# Patient Record
Sex: Male | Born: 1960 | Race: White | Hispanic: No | Marital: Married | Smoking: Never smoker
Health system: Southern US, Community
[De-identification: ages and names within clinical notes are randomized; demographics above are authoritative.]

## PROBLEM LIST (undated history)

## (undated) DIAGNOSIS — I4719 Other supraventricular tachycardia: Secondary | ICD-10-CM

## (undated) DIAGNOSIS — E042 Nontoxic multinodular goiter: Secondary | ICD-10-CM

## (undated) DIAGNOSIS — R002 Palpitations: Secondary | ICD-10-CM

## (undated) DIAGNOSIS — I4891 Unspecified atrial fibrillation: Secondary | ICD-10-CM

## (undated) DIAGNOSIS — G4730 Sleep apnea, unspecified: Secondary | ICD-10-CM

## (undated) DIAGNOSIS — R339 Retention of urine, unspecified: Secondary | ICD-10-CM

## (undated) DIAGNOSIS — I8392 Asymptomatic varicose veins of left lower extremity: Secondary | ICD-10-CM

## (undated) DIAGNOSIS — N400 Enlarged prostate without lower urinary tract symptoms: Secondary | ICD-10-CM

## (undated) DIAGNOSIS — G4733 Obstructive sleep apnea (adult) (pediatric): Secondary | ICD-10-CM

## (undated) DIAGNOSIS — N320 Bladder-neck obstruction: Secondary | ICD-10-CM

## (undated) HISTORY — PX: ATRIAL FIBRILLATION ABLATION: SHX5732

## (undated) HISTORY — PX: KNEE SURGERY: SHX244

## (undated) HISTORY — PX: KNEE ARTHROSCOPY: SUR90

## (undated) HISTORY — PX: SHOULDER SURGERY: SHX246

## (undated) HISTORY — PX: ARTHOSCOPIC ROTAOR CUFF REPAIR: SHX5002

## (undated) HISTORY — PX: WRIST SURGERY: SHX841

## (undated) HISTORY — DX: Asymptomatic varicose veins of left lower extremity: I83.92

---

## 2008-06-20 HISTORY — PX: WRIST SURGERY: SHX841

## 2011-06-21 DIAGNOSIS — I480 Paroxysmal atrial fibrillation: Secondary | ICD-10-CM

## 2011-06-21 HISTORY — PX: CARDIAC ELECTROPHYSIOLOGY MAPPING AND ABLATION: SHX1292

## 2011-06-21 HISTORY — PX: CARDIAC CATHETERIZATION: SHX172

## 2011-06-21 HISTORY — DX: Paroxysmal atrial fibrillation: I48.0

## 2015-06-14 ENCOUNTER — Emergency Department (HOSPITAL_COMMUNITY)
Admission: EM | Admit: 2015-06-14 | Discharge: 2015-06-14 | Disposition: A | Discharge status: Home / Self Care | Payer: BLUE CROSS/BLUE SHIELD | Attending: Emergency Medicine | Admitting: Emergency Medicine

## 2015-06-14 ENCOUNTER — Encounter (HOSPITAL_COMMUNITY): Payer: Self-pay | Admitting: Vascular Surgery

## 2015-06-14 ENCOUNTER — Emergency Department (HOSPITAL_COMMUNITY): Payer: BLUE CROSS/BLUE SHIELD

## 2015-06-14 DIAGNOSIS — W010XXA Fall on same level from slipping, tripping and stumbling without subsequent striking against object, initial encounter: Secondary | ICD-10-CM | POA: Diagnosis not present

## 2015-06-14 DIAGNOSIS — R0781 Pleurodynia: Secondary | ICD-10-CM

## 2015-06-14 DIAGNOSIS — Y9289 Other specified places as the place of occurrence of the external cause: Secondary | ICD-10-CM | POA: Diagnosis not present

## 2015-06-14 DIAGNOSIS — W19XXXA Unspecified fall, initial encounter: Secondary | ICD-10-CM

## 2015-06-14 DIAGNOSIS — Z8679 Personal history of other diseases of the circulatory system: Secondary | ICD-10-CM | POA: Diagnosis not present

## 2015-06-14 DIAGNOSIS — Y998 Other external cause status: Secondary | ICD-10-CM | POA: Diagnosis not present

## 2015-06-14 DIAGNOSIS — S29001A Unspecified injury of muscle and tendon of front wall of thorax, initial encounter: Secondary | ICD-10-CM | POA: Insufficient documentation

## 2015-06-14 DIAGNOSIS — Y9389 Activity, other specified: Secondary | ICD-10-CM | POA: Diagnosis not present

## 2015-06-14 HISTORY — DX: Unspecified atrial fibrillation: I48.91

## 2015-06-14 MED ORDER — DIPHENHYDRAMINE HCL 25 MG PO CAPS
25.0000 mg | ORAL_CAPSULE | Freq: Once | ORAL | Status: AC
  Administered 2015-06-14: 25 mg via ORAL
  Filled 2015-06-14: qty 1

## 2015-06-14 MED ORDER — HYDROCODONE-ACETAMINOPHEN 5-325 MG PO TABS
1.0000 | ORAL_TABLET | Freq: Once | ORAL | Status: AC
  Administered 2015-06-14: 1 via ORAL
  Filled 2015-06-14: qty 1

## 2015-06-14 MED ORDER — NAPROXEN 250 MG PO TABS: 250.0000 mg | ORAL_TABLET | Freq: Two times a day (BID) | ORAL | Status: DC

## 2015-06-14 NOTE — Discharge Instructions (Signed)
Rib Contusion A rib contusion is a deep bruise on your rib area. Contusions are the result of a blunt trauma that causes bleeding and injury to the tissues under the skin. A rib contusion may involve bruising of the ribs and of the skin and muscles in the area. The skin overlying the contusion may turn blue, purple, or yellow. Minor injuries will give you a painless contusion, but more severe contusions may stay painful and swollen for a few weeks. CAUSES  A contusion is usually caused by a blow, trauma, or direct force to an area of the body. This often occurs while playing contact sports. SYMPTOMS  Swelling and redness of the injured area.  Discoloration of the injured area.  Tenderness and soreness of the injured area.  Pain with or without movement. DIAGNOSIS  The diagnosis can be made by taking a medical history and performing a physical exam. An X-ray, CT scan, or MRI may be needed to determine if there were any associated injuries, such as broken bones (fractures) or internal injuries. TREATMENT  Often, the best treatment for a rib contusion is rest. Icing or applying cold compresses to the injured area may help reduce swelling and inflammation. Deep breathing exercises may be recommended to reduce the risk of partial lung collapse and pneumonia. Over-the-counter or prescription medicines may also be recommended for pain control. HOME CARE INSTRUCTIONS   Apply ice to the injured area:  Put ice in a plastic bag.  Place a towel between your skin and the bag.  Leave the ice on for 20 minutes, 2-3 times per day.  Take medicines only as directed by your health care provider.  Rest the injured area. Avoid strenuous activity and any activities or movements that cause pain. Be careful during activities and avoid bumping the injured area.  Perform deep-breathing exercises as directed by your health care provider.  Do not lift anything that is heavier than 5 lb (2.3 kg) until your  health care provider approves.  Do not use any tobacco products, including cigarettes, chewing tobacco, or electronic cigarettes. If you need help quitting, ask your health care provider. SEEK MEDICAL CARE IF:   You have increased bruising or swelling.  You have pain that is not controlled with treatment.  You have a fever. SEEK IMMEDIATE MEDICAL CARE IF:   You have difficulty breathing or shortness of breath.  You develop a continual cough, or you cough up thick or bloody sputum.  You feel sick to your stomach (nauseous), you throw up (vomit), or you have abdominal pain.   This information is not intended to replace advice given to you by your health care provider. Make sure you discuss any questions you have with your health care provider.   Document Released: 03/01/2001 Document Revised: 06/27/2014 Document Reviewed: 03/18/2014 Elsevier Interactive Patient Education 2016 [employer]vier Inc.  

## 2015-06-14 NOTE — ED Provider Notes (Signed)
CSN: [id number]     Arrival date & time 06/14/15  1320 History   First MD Initiated Contact with Patient 06/14/15 1630     Chief Complaint  Patient presents with  . Fall   Mathew Phillips is a 54 y.o. male who is here visiting from [facility] who presents complaining of right rib pain after he tripped and fell earlier today. The patient reports he was tripped by a dog today and fell on his right side around 1 PM. He reports 5 out of 10 pain to his right ribs that is worse with deep inspiration. He denies loss of consciousness. He denies shortness of breath or coughing. He has taken nothing for treatment today. The patient denies taking any anticoagulants. Patient denies fevers, cough, wheezing, shortness of breath, headache, numbness, tingling, weakness, double vision, syncope, lightheadedness, dizziness, jaw pain, nausea, vomiting, loss of bowel or bladder control, or rashes.  (Consider location/radiation/quality/duration/timing/severity/associated sxs/prior Treatment) HPI  Past Medical History  Diagnosis Date  . Atrial fibrillation [facility])    Past Surgical History  Procedure Laterality Date  . Shoulder surgery  both  . Atrial fibrillation ablation    . Knee surgery  both  . Wrist surgery Left    No family history on file. Social History  Substance Use Topics  . Smoking status: Never Smoker   . Smokeless tobacco: Never Used  . Alcohol Use: Yes     Comment: rarely    Review of Systems  Constitutional: Negative for fever and chills.  HENT: Negative for congestion and sore throat.   Eyes: Negative for visual disturbance.  Respiratory: Negative for cough, shortness of breath and wheezing.   Cardiovascular: Negative for chest pain and palpitations.  Gastrointestinal: Negative for nausea, vomiting, abdominal pain and diarrhea.  Genitourinary: Negative for dysuria and difficulty urinating.  Musculoskeletal: Positive for arthralgias. Negative for back pain, neck pain  and neck stiffness.  Skin: Negative for rash.  Neurological: Negative for dizziness, syncope, weakness, numbness and headaches.      Allergies  Codeine  Home Medications   Prior to Admission medications   Medication Sig Start Date End Date Taking? Authorizing Provider  naproxen (NAPROSYN) 250 MG tablet Take 1 tablet (250 mg total) by mouth 2 (two) times daily with a meal. 06/14/15   Waynetta Pean, PA-C   BP 113/74 mmHg  Pulse 69  Temp(Src) 97.6 F (36.4 C) (Oral)  Resp 16  SpO2 100% Physical Exam  Constitutional: He is oriented to person, place, and time. He appears well-developed and well-nourished. No distress.  Nontoxic appearing.  HENT:  Head: Normocephalic and atraumatic.  Mouth/Throat: Oropharynx is clear and moist.  Eyes: Conjunctivae are normal. Pupils are equal, round, and reactive to light. Right eye exhibits no discharge. Left eye exhibits no discharge.  Neck: Neck supple.  Cardiovascular: Normal rate, regular rhythm, normal heart sounds and intact distal pulses.  Exam reveals no gallop and no friction rub.   No murmur heard. Pulmonary/Chest: Effort normal and breath sounds normal. No respiratory distress. He has no wheezes. He has no rales. He exhibits tenderness.  Lungs are clear to auscultation bilaterally. Symmetric chest expansion bilaterally. Patient has tenderness to his right lateral chest wall. No overlying skin changes. No flail segment. No ecchymosis, erythema, edema or warmth.  Abdominal: Soft. He exhibits no distension. There is no tenderness.  Musculoskeletal: He exhibits no edema or tenderness.  No midline neck or back tenderness.  Lymphadenopathy:    He has no cervical adenopathy.  Neurological: He is alert and oriented to person, place, and time. No cranial nerve deficit. Coordination normal.  Patient is alert and oriented 3. Cranial nerves are intact. Sensation is intact to his bilateral upper and lower extremities.  Skin: Skin is warm and dry.  No rash noted. He is not diaphoretic. No erythema. No pallor.  Psychiatric: He has a normal mood and affect. His behavior is normal.  Nursing note and vitals reviewed.   ED Course  Procedures (including critical care time) Labs Review Labs Reviewed - No data to display  Imaging Review Dg Ribs Unilateral W/chest Right  06/14/2015  CLINICAL DATA:  The patient was knocked down by dog today with a fall. The patient heard a crunch. Initial encounter. EXAM: RIGHT RIBS AND CHEST - 3+ VIEW COMPARISON:  None. FINDINGS: There is some biapical scarring. No pneumothorax or pleural effusion. Heart size is normal. No acute fracture is identified. Remote left rib fractures are noted. The patient is status post right shoulder surgery. IMPRESSION: No acute abnormality. Electronically Signed   By: Inge Rise M.D.   On: 06/14/2015 14:45   I have personally reviewed and evaluated these images as part of my medical decision-making.   EKG Interpretation None      Filed Vitals:   06/14/15 1329 06/14/15 1613 06/14/15 1630  BP: 122/76 119/78 113/74  Pulse: 84 69 69  Temp: 97.6 F (36.4 C)    TempSrc: Oral    Resp: 16 16   SpO2: 100% 98% 100%     MDM   Meds given in ED:  Medications  HYDROcodone-acetaminophen (NORCO/VICODIN) 5-325 MG per tablet 1 tablet (not administered)  diphenhydrAMINE (BENADRYL) capsule 25 mg (not administered)    New Prescriptions   NAPROXEN (NAPROSYN) 250 MG TABLET    Take 1 tablet (250 mg total) by mouth 2 (two) times daily with a meal.    Final diagnoses:  Fall, initial encounter  Rib pain on right side   This is a 54 y.o. male who is here visiting from [place] who presents complaining of right rib pain after he tripped and fell earlier today. The patient reports he was tripped by a dog today and fell on his right side around 1 PM. He reports 5 out of 10 pain to his right ribs that is worse with deep inspiration. He denies loss of consciousness.  He denies shortness of breath or coughing. He has taken nothing for treatment today. The patient denies taking any anticoagulants.  On exam the patient is afebrile and nontoxic appearing. His lungs are clear to auscultation bilaterally. Symmetric chest chest expansion bilaterally. Right lateral chest wall is tender to palpation. No crepitus, ecchymosis, erythema or warmth. Right rib x-ray with chest is unremarkable. No evidence of rib fractures. Patient has no evidence of other injury. Patient reports he'll be traveling home to [place] today. Will provide the patient with Norco in the emergency department. Patient reports this is what he usually uses for pain but usually needs to take Benadryl with this. We'll provided Benadryl. We'll discharge with prescription for naproxen for pain control. I discussed strict return precautions. I advised the patient to follow-up with their primary care provider this week. I advised the patient to return to the emergency department with new or worsening symptoms or new concerns. The patient verbalized understanding and agreement with plan.      Waynetta Pean, PA-C 06/14/15 1712  Wandra Arthurs, MD 06/14/15 [phone]

## 2015-06-14 NOTE — ED Notes (Signed)
Pt reports to the ED for eval of headache, backache, and right ribcage pain. Pt reports today the dog tripped him and he fell flat on his back. Pt denies any LOC. Pt reports severe pain with deep breaths. Pt denies any numbness, tingling, paralysis, or bowel or bladder changes. Pt is not on blood thinners. Pt A&Ox4, resp e/u, and skin warm and dry.

## 2017-06-15 ENCOUNTER — Encounter: Payer: Self-pay | Admitting: *Deleted

## 2017-06-29 ENCOUNTER — Telehealth: Payer: Self-pay | Admitting: Cardiovascular Disease

## 2017-06-29 NOTE — Telephone Encounter (Signed)
06/29/2017 Received faxed medical records from [facility] for upcoming appointment with Dr. Claiborne Billings on 07/03/2017 @ 11:20.  Records given to [facility]. cbr

## 2017-07-03 ENCOUNTER — Encounter: Payer: Self-pay | Admitting: Cardiovascular Disease

## 2017-07-03 ENCOUNTER — Ambulatory Visit (INDEPENDENT_AMBULATORY_CARE_PROVIDER_SITE_OTHER): Payer: BLUE CROSS/BLUE SHIELD | Admitting: Cardiovascular Disease

## 2017-07-03 VITALS — BP 122/79 | HR 66 | Ht 73.0 in | Wt 233.8 lb

## 2017-07-03 DIAGNOSIS — Z9889 Other specified postprocedural states: Secondary | ICD-10-CM | POA: Diagnosis not present

## 2017-07-03 DIAGNOSIS — Z13220 Encounter for screening for lipoid disorders: Secondary | ICD-10-CM

## 2017-07-03 DIAGNOSIS — I4891 Unspecified atrial fibrillation: Secondary | ICD-10-CM

## 2017-07-03 DIAGNOSIS — Z8679 Personal history of other diseases of the circulatory system: Secondary | ICD-10-CM

## 2017-07-03 DIAGNOSIS — G4733 Obstructive sleep apnea (adult) (pediatric): Secondary | ICD-10-CM

## 2017-07-03 DIAGNOSIS — Z79899 Other long term (current) drug therapy: Secondary | ICD-10-CM

## 2017-07-03 MED ORDER — METOPROLOL TARTRATE 50 MG PO TABS: 50.0000 mg | ORAL_TABLET | ORAL | 3 refills | Status: DC | PRN

## 2017-07-03 NOTE — Progress Notes (Signed)
Cardiology Office Note    Date:  07/10/2017   ID:  Mathew Phillips, DOB July 11, 1960, MRN [id number]  PCP:  Patient, No Pcp Per  Cardiologist:  Shelva Majestic, MD   Chief Complaint  Patient presents with  . New Patient (Initial Visit)    Pt states no Sx.     History of Present Illness:  Mathew Phillips is a 57 y.o. male who presents to the office today to establish cardiology care with me after moving from the [place], [place] area back to [place] area [place].  Mathew Phillips has history of atrial tachycardia and in March 2014 was evaluated by Dr Casimer Bilis at [facility] heart specialists.  He had developed an episode of wide complex tachycardia which was picked up on outpatient monitoring in 2013 that was presumed to be VT.  He underwent cardiac catheterization, echo, and EPS as well as cardiac MRI.  Catheterization showed an EF of 60%; TEE revealed a normal EF.  EPS did not reveal inducible arrhythmia and he underwent successful cardioversion to sinus rhythm.  A cardiac MRI did not reveal infiltrative disease, and there was no evidence for RV dysplasia.  He was discharged with flecainide as well as metoprolol.   He underwent [facility] for atrial fibrillation.  Subsequently, he has stabilized and has not had any recurrent awareness of atrial fibrillation.  He tells me that he was diagnosed with obstructive sleep apnea one year ago was started on CPAP.  He self discontinued Toprol 6 months ago.  He denies any recent palpitations.  He denies any change in exercise tolerance.  He denies PND, orthopnea.  He did move to [place] and works in [place] and presents to establish cardiology care.  Past Medical History:  Diagnosis Date  . Atrial fibrillation [facility])     Past Surgical History:  Procedure Laterality Date  . ATRIAL FIBRILLATION ABLATION    . KNEE SURGERY  both  . SHOULDER SURGERY  both  . WRIST SURGERY Left     Current Medications: Outpatient Medications Prior to Visit    Medication Sig Dispense Refill  . naproxen (NAPROSYN) 250 MG tablet Take 1 tablet (250 mg total) by mouth 2 (two) times daily with a meal. 30 tablet 0  . levofloxacin (LEVAQUIN) 750 MG tablet      No facility-administered medications prior to visit.      Allergies:   Codeine   Social History   Socioeconomic History  . Marital status: Married    Spouse name: None  . Number of children: None  . Years of education: None  . Highest education level: None  Social Needs  . Financial resource strain: None  . Food insecurity - worry: None  . Food insecurity - inability: None  . Transportation needs - medical: None  . Transportation needs - non-medical: None  Occupational History  . None  Tobacco Use  . Smoking status: Never Smoker  . Smokeless tobacco: Never Used  Substance and Sexual Activity  . Alcohol use: Yes    Comment: rarely  . Drug use: No  . Sexual activity: None  Other Topics Concern  . None  Social History Narrative  . None    Social history is notable in that he is married for 28 years.  He has 2 children ages 56 and 8.  There is no history of tobacco use.  He drinks wine on occasion.  He exercises at least 3-5 days per week doing cardio and weights.  He  works for [employer] as a Psychologist, forensic  Family History:  The patient's family history is not on file.  Family history is notable in that his mother died at age 29 and had hypertension, and COPD.  Father died at age 61 with lung cancer.  He was a heavy smoker.  He has a brother who is living at age 21 and has COPD.  He has 4 sisters.  2 sisters have cancer.  There is history of hypertension and 3 of his sisters.  ROS General: Negative; No fevers, chills, or night sweats;  HEENT: Negative; No changes in vision or hearing, sinus congestion, difficulty swallowing Pulmonary: Negative; No cough, wheezing, shortness of breath, hemoptysis Cardiovascular: See history of present illness GI:  Negative; No nausea, vomiting, diarrhea, or abdominal pain GU: Negative; No dysuria, hematuria, or difficulty voiding Musculoskeletal: Negative; no myalgias, joint pain, or weakness Hematologic/Oncology: Negative; no easy bruising, bleeding Endocrine: Negative; no heat/cold intolerance; no diabetes Neuro: Negative; no changes in balance, headaches Skin: Negative; No rashes or skin lesions Psychiatric: Negative; No behavioral problems, depression Sleep: Negative; No snoring, daytime sleepiness, hypersomnolence, bruxism, restless legs, hypnogognic hallucinations, no cataplexy Other comprehensive 14 point system review is negative.   PHYSICAL EXAM:   VS:  BP 122/79   Pulse 66   Ht _0  (1.854 m)   Wt 233 lb 12.8 oz (106.1 kg)   BMI 30.85 kg/m     Repeat blood pressure by me was 122/72  Wt Readings from Last 3 Encounters:  07/03/17 233 lb 12.8 oz (106.1 kg)    General: Alert, oriented, no distress.  Skin: normal turgor, no rashes, warm and dry HEENT: Normocephalic, atraumatic. Pupils equal round and reactive to light; sclera anicteric; extraocular muscles intact; Fundi no AV nicking.  No hemorrhages or exudates.  Disks flat. Nose without nasal septal hypertrophy Mouth/Parynx benign; Mallinpatti scale 3 Neck: No JVD, no carotid bruits; normal carotid upstroke Lungs: clear to ausculatation and percussion; no wheezing or rales Chest wall: Moderate pectus excavatum; without tenderness to palpitation Heart: PMI not displaced, RRR, s1 s2 normal, 1/6 systolic murmur, no diastolic murmur, no rubs, gallops, thrills, or heaves Abdomen: soft, nontender; no hepatosplenomehaly, BS+; abdominal aorta nontender and not dilated by palpation. Back: no CVA tenderness Pulses 2+ Musculoskeletal: full range of motion, normal strength, no joint deformities Extremities: Lower extremity varicose veins; no clubbing cyanosis or edema, Homan's sign negative  Neurologic: grossly nonfocal; Cranial nerves  grossly wnl Psychologic: Normal mood and affect   Studies/Labs Reviewed:   EKG:  EKG is ordered today.  ECG (independently read by me): Normal sinus rhythm at 66 bpm.  Incomplete right bundle branch block.  Normal intervals.  No ectopy.  Recent Labs: BMP Latest Ref Rng & Units 07/04/2017  Glucose 65 - 99 mg/dL 94  BUN 6 - 24 mg/dL 13  Creatinine 0.76 - 1.27 mg/dL 0.90  BUN/Creat Ratio 9 - 20 14  Sodium 134 - 144 mmol/L 146(H)  Potassium 3.5 - 5.2 mmol/L 4.3  Chloride 96 - 106 mmol/L 109(H)  CO2 20 - 29 mmol/L 23  Calcium 8.7 - 10.2 mg/dL 9.0     Hepatic Function Latest Ref Rng & Units 07/04/2017  Total Protein 6.0 - 8.5 g/dL 6.8  Albumin 3.5 - 5.5 g/dL 4.3  AST 0 - 40 IU/L 15  ALT 0 - 44 IU/L 22  Alk Phosphatase 39 - 117 IU/L 83  Total Bilirubin 0.0 - 1.2 mg/dL 0.3    CBC Latest  Ref Rng & Units 07/04/2017  WBC 3.4 - 10.8 x10E3/uL 6.5  Hemoglobin 13.0 - 17.7 g/dL 13.9  Hematocrit 37.5 - 51.0 % 40.1  Platelets 150 - 379 x10E3/uL 302   Lab Results  Component Value Date   MCV 87 07/04/2017   Lab Results  Component Value Date   TSH 0.898 07/04/2017   No results found for: HGBA1C   BNP No results found for: BNP  ProBNP No results found for: PROBNP   Lipid Panel     Component Value Date/Time   CHOL 146 07/04/2017 0826   TRIG 51 07/04/2017 0826   HDL 40 07/04/2017 0826   CHOLHDL 3.7 07/04/2017 0826   LDLCALC 96 07/04/2017 0826     RADIOLOGY: No results found.   Additional studies/ records that were reviewed today include:  I personally reviewed the patient's medical records from 2013 , his echo Doppler study as well as his office records from [facility] heart specialists   ASSESSMENT:    1. Atrial fibrillation, unspecified type ([place])   2. S/P ablation of atrial fibrillation   3. Medication management   4. Lipid screening   5. OSA (obstructive sleep apnea)   6. H/O varicose veins of lower extremity      PLAN:  Mathew Phillips is a  57 year old gentleman who underwent ablation for atrial fibrillation in 2013 in [place], [place].  At the time he was also felt to have episodes of wide-complex tachycardia with a presumptive diagnosis of ventricular tachycardia.  An EPS he was noninducible.  A cardiac MRI did not demonstrate any infiltrative disease, and there was no evidence for RV dysplasia.  Since undergoing his ablation.  He has been maintaining sinus rhythm.  He had stopped taking Toprol-XL as well as his primary antiarrhythmic therapy.  At present, he just takes Naprosyn forthright his symptoms.  He also has a history of lower extremity varicose veins.  Presently, I am recommending he undergo a follow-up echo Doppler study.  5.  In half years since his last evaluation.  I am recommending laboratory be obtained.  I'm giving him a prescription for metoprolol, tartrate to have available in the event he developed an episode of recurrent tachycardia arrhythmia or atrial fibrillation.  He was also recently started on CPAP one year ago for obstructive sleep apnea.  I will try to find the specifics of his sleep study and the severity of his sleep apnea.  We will arrange for him to be set up with a DME company here in [place], based on his insurance.  I will see him in 6 months for cardiology reevaluation.   Medication Adjustments/Labs and Tests Ordered: Current medicines are reviewed at length with the patient today.  Concerns regarding medicines are outlined above.  Medication changes, Labs and Tests ordered today are listed in the Patient Instructions below. Patient Instructions  Medication Instructions:  Take metoprolol tartrate (Lopressor) 50 mg AS NEEDED  Labwork: Please return for FASTING labs (CMET, CBC, Lipid, TSH)  Testing/Procedures: Your physician has requested that you have an echocardiogram. Echocardiography is a painless test that uses sound waves to create images of your heart. It provides your doctor with  information about the size and shape of your heart and how well your heart's chambers and valves are working. This procedure takes approximately one hour. There are no restrictions for this procedure.  This will be done at our [facility] location:  [place]: Your physician wants  you to follow-up in: 6 months with Dr. Claiborne Billings. You will receive a reminder letter in the mail two months in advance. If you don't receive a letter, please call our office to schedule the follow-up appointment.   Any Other Special Instructions Will Be Listed Below (If Applicable).     If you need a refill on your cardiac medications before your next appointment, please call your pharmacy.      Signed, Shelva Majestic, MD  07/10/2017 5:13 PM    [facility] [address], [place], [place], [place]  [address] Phone: [phone]

## 2017-07-03 NOTE — Patient Instructions (Signed)
Medication Instructions:  Take metoprolol tartrate (Lopressor) 50 mg AS NEEDED  Labwork: Please return for FASTING labs (CMET, CBC, Lipid, TSH)  Testing/Procedures: Your physician has requested that you have an echocardiogram. Echocardiography is a painless test that uses sound waves to create images of your heart. It provides your doctor with information about the size and shape of your heart and how well your heart's chambers and valves are working. This procedure takes approximately one hour. There are no restrictions for this procedure.  This will be done at our [facility] location:  [place] wants you to follow-up in: 6 months with Dr. Claiborne Billings. You will receive a reminder letter in the mail two months in advance. If you don't receive a letter, please call our office to schedule the follow-up appointment.   Any Other Special Instructions Will Be Listed Below (If Applicable).     If you need a refill on your cardiac medications before your next appointment, please call your pharmacy.

## 2017-07-04 LAB — COMPREHENSIVE METABOLIC PANEL
ALT: 22 IU/L (ref 0–44)
AST: 15 IU/L (ref 0–40)
Albumin/Globulin Ratio: 1.7 (ref 1.2–2.2)
Albumin: 4.3 g/dL (ref 3.5–5.5)
Alkaline Phosphatase: 83 IU/L (ref 39–117)
BUN/Creatinine Ratio: 14 (ref 9–20)
BUN: 13 mg/dL (ref 6–24)
Bilirubin Total: 0.3 mg/dL (ref 0.0–1.2)
CO2: 23 mmol/L (ref 20–29)
Calcium: 9 mg/dL (ref 8.7–10.2)
Chloride: 109 mmol/L — ABNORMAL HIGH (ref 96–106)
Creatinine, Ser: 0.9 mg/dL (ref 0.76–1.27)
GFR calc Af Amer: 111 mL/min/{1.73_m2} (ref >59)
GFR calc non Af Amer: 96 mL/min/{1.73_m2} (ref >59)
Globulin, Total: 2.5 g/dL (ref 1.5–4.5)
Glucose: 94 mg/dL (ref 65–99)
Potassium: 4.3 mmol/L (ref 3.5–5.2)
Sodium: 146 mmol/L — ABNORMAL HIGH (ref 134–144)
Total Protein: 6.8 g/dL (ref 6.0–8.5)

## 2017-07-04 LAB — CBC
Hematocrit: 40.1 % (ref 37.5–51.0)
Hemoglobin: 13.9 g/dL (ref 13.0–17.7)
MCH: 30 pg (ref 26.6–33.0)
MCHC: 34.7 g/dL (ref 31.5–35.7)
MCV: 87 fL (ref 79–97)
Platelets: 302 10*3/uL (ref 150–379)
RBC: 4.63 x10E6/uL (ref 4.14–5.80)
RDW: 13.1 % (ref 12.3–15.4)
WBC: 6.5 10*3/uL (ref 3.4–10.8)

## 2017-07-04 LAB — TSH: TSH: 0.898 u[IU]/mL (ref 0.450–4.500)

## 2017-07-04 LAB — LIPID PANEL
Chol/HDL Ratio: 3.7 ratio (ref 0.0–5.0)
Cholesterol, Total: 146 mg/dL (ref 100–199)
HDL: 40 mg/dL (ref >39)
LDL Calculated: 96 mg/dL (ref 0–99)
Triglycerides: 51 mg/dL (ref 0–149)
VLDL Cholesterol Cal: 10 mg/dL (ref 5–40)

## 2017-07-10 ENCOUNTER — Encounter: Payer: Self-pay | Admitting: Cardiovascular Disease

## 2017-07-10 ENCOUNTER — Other Ambulatory Visit: Payer: Self-pay

## 2017-07-10 ENCOUNTER — Ambulatory Visit (HOSPITAL_COMMUNITY): Payer: BLUE CROSS/BLUE SHIELD | Attending: Internal Medicine

## 2017-07-10 DIAGNOSIS — Z8249 Family history of ischemic heart disease and other diseases of the circulatory system: Secondary | ICD-10-CM | POA: Insufficient documentation

## 2017-07-10 DIAGNOSIS — I4891 Unspecified atrial fibrillation: Secondary | ICD-10-CM | POA: Diagnosis not present

## 2017-07-10 DIAGNOSIS — I517 Cardiomegaly: Secondary | ICD-10-CM | POA: Insufficient documentation

## 2017-07-27 ENCOUNTER — Encounter: Payer: Self-pay | Admitting: *Deleted

## 2017-10-13 ENCOUNTER — Emergency Department (HOSPITAL_COMMUNITY): Payer: BLUE CROSS/BLUE SHIELD

## 2017-10-13 ENCOUNTER — Encounter (HOSPITAL_COMMUNITY): Payer: Self-pay | Admitting: *Deleted

## 2017-10-13 ENCOUNTER — Emergency Department (HOSPITAL_COMMUNITY)
Admission: EM | Admit: 2017-10-13 | Discharge: 2017-10-13 | Disposition: A | Discharge status: Home / Self Care | Payer: BLUE CROSS/BLUE SHIELD | Attending: Emergency Medicine | Admitting: Emergency Medicine

## 2017-10-13 DIAGNOSIS — W450XXA Nail entering through skin, initial encounter: Secondary | ICD-10-CM | POA: Insufficient documentation

## 2017-10-13 DIAGNOSIS — Z79899 Other long term (current) drug therapy: Secondary | ICD-10-CM | POA: Insufficient documentation

## 2017-10-13 DIAGNOSIS — S99921A Unspecified injury of right foot, initial encounter: Secondary | ICD-10-CM | POA: Diagnosis present

## 2017-10-13 DIAGNOSIS — Y92017 Garden or yard in single-family (private) house as the place of occurrence of the external cause: Secondary | ICD-10-CM | POA: Diagnosis not present

## 2017-10-13 DIAGNOSIS — I4891 Unspecified atrial fibrillation: Secondary | ICD-10-CM | POA: Insufficient documentation

## 2017-10-13 DIAGNOSIS — S91131A Puncture wound without foreign body of right great toe without damage to nail, initial encounter: Secondary | ICD-10-CM

## 2017-10-13 DIAGNOSIS — Z23 Encounter for immunization: Secondary | ICD-10-CM | POA: Diagnosis not present

## 2017-10-13 DIAGNOSIS — Y93H3 Activity, building and construction: Secondary | ICD-10-CM | POA: Diagnosis not present

## 2017-10-13 DIAGNOSIS — S91141A Puncture wound with foreign body of right great toe without damage to nail, initial encounter: Secondary | ICD-10-CM | POA: Diagnosis not present

## 2017-10-13 DIAGNOSIS — Y999 Unspecified external cause status: Secondary | ICD-10-CM | POA: Diagnosis not present

## 2017-10-13 MED ORDER — LIDOCAINE HCL 2 % IJ SOLN
10.0000 mL | Freq: Once | INTRAMUSCULAR | Status: AC
  Administered 2017-10-13: 200 mg via INTRADERMAL
  Filled 2017-10-13: qty 20

## 2017-10-13 MED ORDER — CEPHALEXIN 500 MG PO CAPS: 500.0000 mg | ORAL_CAPSULE | Freq: Three times a day (TID) | ORAL | 0 refills | Status: AC

## 2017-10-13 MED ORDER — HYDROCODONE-ACETAMINOPHEN 5-325 MG PO TABS: 1.0000 | ORAL_TABLET | Freq: Four times a day (QID) | ORAL | 0 refills | Status: DC | PRN

## 2017-10-13 MED ORDER — TETANUS-DIPHTH-ACELL PERTUSSIS 5-2.5-18.5 LF-MCG/0.5 IM SUSP
0.5000 mL | Freq: Once | INTRAMUSCULAR | Status: AC
  Administered 2017-10-13: 0.5 mL via INTRAMUSCULAR
  Filled 2017-10-13: qty 0.5

## 2017-10-13 MED ORDER — OXYCODONE-ACETAMINOPHEN 5-325 MG PO TABS
1.0000 | ORAL_TABLET | ORAL | Status: AC | PRN
  Administered 2017-10-13 (×2): 1 via ORAL
  Filled 2017-10-13 (×2): qty 1

## 2017-10-13 NOTE — Discharge Instructions (Addendum)
Please take all of your antibiotics until finished!   You may develop abdominal discomfort or diarrhea from the antibiotic.  You may help offset this with probiotics which you can buy or get in yogurt. Do not eat  or take the probiotics until 2 hours after your antibiotic.   Alternate 600 mg of ibuprofen and [phone] mg of Tylenol every 3 hours as needed for pain. Do not exceed 4000 mg of Tylenol daily.  You may take Norco as needed for severe pain but do not drive, drink alcohol, or operate heavy machinery while on this medicine as it may make you drowsy.  Keep the area clean and dry.  Change the dressing twice daily.  Follow-up with podiatry if any symptoms persist.  Return to the emergency department if any concerning signs or symptoms develop such as fever, redness, swelling, abnormal drainage, or loss of pulses.

## 2017-10-13 NOTE — ED Triage Notes (Signed)
Pt has 1.5 inch nail going through his right big toe. Pt accidentally shot a nail through his right toe at ~1315 today. Pt has numbness and pain in his right big toe.

## 2017-10-13 NOTE — ED Provider Notes (Signed)
[facility] DEPT Provider Note   CSN: [id number] Arrival date & time: 10/13/17  1411     History   Chief Complaint Chief Complaint  Patient presents with  . Puncture Wound    HPI Mathew Phillips is a 57 y.o. male with history of A. fib presents today for evaluation of right great toe pain after shooting a nail gun through the right toe.  She states that at around 1:15 PM today he was working on his roof.  He states that he descended down the ladder and was on the ground when he bent over and felt dizzy.  He denies lightheadedness, syncope, head injury, chest pain, or shortness of breath.  He states that the dizziness lasted a few seconds but in the process he accidentally shot his nail gun through his right great toe.  He has had episodes of dizziness in the past but has not required medication for his positional vertigo. No dizziness at this time. He endorses constant aching pain to the medial aspect of the right great toe at the location of the puncture wound.  Pain does not radiate.  Pain worsens with any movement.  He does endorse numbness to the medial aspect of the right great toe.  He does not think his tetanus is up-to-date.  He is not on any blood thinners.  The history is provided by the patient.    Past Medical History:  Diagnosis Date  . Atrial fibrillation ([place])     There are no active problems to display for this patient.   Past Surgical History:  Procedure Laterality Date  . ATRIAL FIBRILLATION ABLATION    . KNEE SURGERY  both  . SHOULDER SURGERY  both  . WRIST SURGERY Left         Home Medications    Prior to Admission medications   Medication Sig Start Date End Date Taking? Authorizing Provider  naproxen (NAPROSYN) 250 MG tablet Take 1 tablet (250 mg total) by mouth 2 (two) times daily with a meal. 06/14/15  Yes Waynetta Pean, PA-C  cephALEXin (KEFLEX) 500 MG capsule Take 1 capsule (500 mg total) by mouth 3 (three) times  daily for 3 days. 10/13/17 10/16/17  Rodell Perna A, PA-C  HYDROcodone-acetaminophen (NORCO/VICODIN) 5-325 MG tablet Take 1 tablet by mouth every 6 (six) hours as needed for severe pain. 10/13/17   Nils Flack, Rmoni Keplinger A, PA-C  metoprolol tartrate (LOPRESSOR) 50 MG tablet Take 1 tablet (50 mg total) by mouth as needed. 07/03/17 10/01/17  Troy Sine, MD    Family History No family history on file.  Social History Social History   Tobacco Use  . Smoking status: Never Smoker  . Smokeless tobacco: Never Used  Substance Use Topics  . Alcohol use: Yes    Comment: rarely  . Drug use: No     Allergies   Codeine and Yellow jacket venom [bee venom]   Review of Systems Review of Systems  Respiratory: Negative for shortness of breath.   Cardiovascular: Negative for chest pain.  Musculoskeletal: Positive for arthralgias.  Neurological: Positive for dizziness (resolved) and numbness. Negative for syncope, weakness, light-headedness and headaches.  All other systems reviewed and are negative.    Physical Exam Updated Vital Signs BP 113/80 (BP Location: Right Arm)   Pulse (!) 57   Temp 98.7 F (37.1 C) (Oral)   Resp 14   Ht 6\' 1"  (1.854 m)   Wt 97.1 kg (214 lb)   SpO2 100%  BMI 28.23 kg/m   Physical Exam  Constitutional: He is oriented to person, place, and time. He appears well-developed and well-nourished. No distress.  HENT:  Head: Normocephalic and atraumatic.  Eyes: Conjunctivae are normal. Right eye exhibits no discharge. Left eye exhibits no discharge.  Neck: No JVD present. No tracheal deviation present.  Cardiovascular: Normal rate and intact distal pulses.  2+ DP/PT pulses bilaterally  Pulmonary/Chest: Effort normal.  Abdominal: He exhibits no distension.  Musculoskeletal: He exhibits no edema.  See attached images.  There is a 1.5 inch nail embedded in the right great toe medially.  There is tenderness to palpation focally overlying this area.  No crepitus.  5/5 strength  BLE major muscle groups including EHL strength.  No erythema or drainage noted.  Neurological: He is alert and oriented to person, place, and time. No cranial nerve deficit. He exhibits normal muscle tone.  Fluent speech with no evidence of dysarthria or aphasia, no facial droop, slightly decreased sensation to the right great toe medially and along the plantar aspect of the right great toe.  Otherwise sensation intact to soft touch of extremities.  Skin: Skin is warm and dry. Capillary refill takes less than 2 seconds. No erythema.  Psychiatric: He has a normal mood and affect. His behavior is normal.  Nursing note and vitals reviewed.      ED Treatments / Results  Labs (all labs ordered are listed, but only abnormal results are displayed) Labs Reviewed - No data to display  EKG None  Radiology Dg Foot Complete Right  Result Date: 10/13/2017 CLINICAL DATA:  Nail gun injury to right foot. EXAM: RIGHT FOOT COMPLETE - 3+ VIEW COMPARISON:  None. FINDINGS: A nail is noted within the soft tissues in the medial great toe near the base of the first proximal phalanx. No acute fracture or dislocation. Joint spaces are preserved. Bone mineralization is normal. Small plantar enthesophyte. IMPRESSION: Nail within the soft tissues at the medial base of the great toe. No acute osseous abnormality. Electronically Signed   By: Titus Dubin M.D.   On: 10/13/2017 16:25    Procedures .Foreign Body Removal Date/Time: 10/13/2017 6:26 PM Performed by: Renita Papa, PA-C Authorized by: Renita Papa, PA-C  Consent: Verbal consent obtained. Consent given by: patient Patient understanding: patient states understanding of the procedure being performed Patient consent: the patient's understanding of the procedure matches consent given Imaging studies: imaging studies available Required items: required blood products, implants, devices, and special equipment available Patient identity confirmed: verbally  with patient and arm band Body area: skin General location: lower extremity Location details: right big toe Anesthesia: local infiltration  Anesthesia: Local Anesthetic: lidocaine 2% without epinephrine Anesthetic total: 5 mL  Sedation: Patient sedated: no  Patient restrained: no Patient cooperative: yes Localization method: visualized Removal mechanism: hemostat Dressing: antibiotic ointment and dressing applied Tendon involvement: none Depth: deep Complexity: simple 1 objects recovered. Objects recovered: 1.5 inch metal nail with two copper wires Post-procedure assessment: foreign body removed Patient tolerance: Patient tolerated the procedure well with no immediate complications   (including critical care time)  Medications Ordered in ED Medications  oxyCODONE-acetaminophen (PERCOCET/ROXICET) 5-325 MG per tablet 1 tablet (1 tablet Oral Given 10/13/17 1727)  lidocaine (XYLOCAINE) 2 % (with pres) injection 200 mg (200 mg Intradermal Given 10/13/17 1727)  Tdap (BOOSTRIX) injection 0.5 mL (0.5 mLs Intramuscular Given 10/13/17 1727)     Initial Impression / Assessment and Plan / ED Course  I have reviewed the triage  vital signs and the nursing notes.  Pertinent labs & imaging results that were available during my care of the patient were reviewed by me and considered in my medical decision making (see chart for details).     Patient presents for evaluation of puncture wound of the right great toe in which he accidentally shot a nail with a nail gun through the medial aspect of the toe.  He is afebrile, vital signs are stable.  He is nontoxic in appearance.  No focal neurologic deficits.  He states that he developed positional dizziness which caused him to accidentally set off the nail gun but he had no lightheadedness or syncope.  Dizziness appears consistent with BPPV which the patient states that he has experienced intermittently but has not required medication for.  Tetanus  was updated in the ED.  The area was anesthetized locally and the nail was removed successfully.  Bleeding was controlled.  The area was extensively irrigated and the wound was dressed.  Discussed wound care.  Will discharge with 3-day course of antibiotics prophylactically.  Discussed pain control.  Discussed strict ED return precautions.  Patient will follow-up with podiatry if symptoms persist.  Patient and patient's wife verbalized understanding of and agreement with plan and patient is stable for discharge home at this time. Final Clinical Impressions(s) / ED Diagnoses   Final diagnoses:  Puncture wound of right great toe without foreign body without damage to nail, initial encounter    ED Discharge Orders        Ordered    cephALEXin (KEFLEX) 500 MG capsule  3 times daily     10/13/17 1829    HYDROcodone-acetaminophen (NORCO/VICODIN) 5-325 MG tablet  Every 6 hours PRN     10/13/17 1829       Renita Papa, PA-C 10/14/17 0015    Blanchie Dessert, MD 10/14/17 2039

## 2018-02-18 ENCOUNTER — Other Ambulatory Visit: Payer: Self-pay

## 2018-02-18 ENCOUNTER — Emergency Department (HOSPITAL_BASED_OUTPATIENT_CLINIC_OR_DEPARTMENT_OTHER): Payer: BLUE CROSS/BLUE SHIELD

## 2018-02-18 ENCOUNTER — Encounter (HOSPITAL_COMMUNITY): Payer: Self-pay | Admitting: Emergency Medicine

## 2018-02-18 ENCOUNTER — Encounter (HOSPITAL_COMMUNITY): Payer: BLUE CROSS/BLUE SHIELD

## 2018-02-18 ENCOUNTER — Emergency Department (HOSPITAL_COMMUNITY)
Admission: EM | Admit: 2018-02-18 | Discharge: 2018-02-18 | Disposition: A | Discharge status: Home / Self Care | Payer: BLUE CROSS/BLUE SHIELD | Attending: Emergency Medicine | Admitting: Emergency Medicine

## 2018-02-18 DIAGNOSIS — M7989 Other specified soft tissue disorders: Secondary | ICD-10-CM

## 2018-02-18 DIAGNOSIS — R609 Edema, unspecified: Secondary | ICD-10-CM

## 2018-02-18 DIAGNOSIS — R2242 Localized swelling, mass and lump, left lower limb: Secondary | ICD-10-CM | POA: Diagnosis present

## 2018-02-18 DIAGNOSIS — Z8679 Personal history of other diseases of the circulatory system: Secondary | ICD-10-CM | POA: Diagnosis not present

## 2018-02-18 DIAGNOSIS — I8392 Asymptomatic varicose veins of left lower extremity: Secondary | ICD-10-CM

## 2018-02-18 NOTE — ED Triage Notes (Signed)
Pt with edema to LLE and LLE pain since yesterday. Pt states he noticed varicosities in LLE since yesterday as well. Pt with numbness and tingling in L foot that started today. Pt also states that edema has worsened going up the leg.

## 2018-02-18 NOTE — Progress Notes (Signed)
VASCULAR LAB PRELIMINARY  PRELIMINARY  PRELIMINARY  PRELIMINARY  Left lower extremity venous duplex completed.    Preliminary report:  There is no DVT or SVT noted in the left lower extremity.  Non thrombosed varicosities noted throughout the left calf.  There is an area of muscle disturbance noted mid calf, but no blatant evidence of muscle tear.  Gave results to Dr. Denman George, Kalik Hoare, RVT 02/18/2018, 3:52 PM

## 2018-02-18 NOTE — Discharge Instructions (Addendum)
The ultrasound showed no DVT today which is great. The ultrasound showed varicose veins and possibly a muscle strain.  As we discussed, you may use Tylenol and/or Ibuprofen for the muscle strain. I recommend that you wear compression socks during the day and elevate your feet when resting to assist with swelling and varicose veins.  Please follow-up with your PCP regarding the varicose veins if you decide to pursue other treatment options.  Thank you for allowing me to take care of you today! Good luck with the car!

## 2018-02-18 NOTE — ED Provider Notes (Signed)
[facility] DEPT Provider Note  CSN: [id number] Arrival date & time: 02/18/18  1217  History   Chief Complaint Chief Complaint  Patient presents with  . Leg Pain    L  . Leg Swelling   HPI Mathew Phillips is a 57 y.o. male with a medical history of a-fib and varicose veins who presented to the ED for left leg swelling x2 days. He describes swelling from his left calf and has noticed increased swelling above the knee as well that began yesterday. He states that this morning the swelling was decreased, but reports it was swollen more after church. Endorses tenderness in the calf along with a tingling sensation.  Denies fever, skin rashes/lesions, recent trauma or injury, recent wounds or bites. Denies chest pain, SOB, palpitations or arthralgias. Patient states that he works in [employer] and frequent flies for his work trips or is sedentary when he is in the office. Patient reports having a similar episode of leg swelling in 09/2017.  Past Medical History:  Diagnosis Date  . Atrial fibrillation ([place])     There are no active problems to display for this patient.   Past Surgical History:  Procedure Laterality Date  . ATRIAL FIBRILLATION ABLATION    . KNEE SURGERY  both  . SHOULDER SURGERY  both  . WRIST SURGERY Left         Home Medications    Prior to Admission medications   Medication Sig Start Date End Date Taking? Authorizing Provider  naproxen sodium (ALEVE) 220 MG tablet Take 440 mg by mouth daily as needed (for pain or headache).   Yes [provider]  HYDROcodone-acetaminophen (NORCO/VICODIN) 5-325 MG tablet Take 1 tablet by mouth every 6 (six) hours as needed for severe pain. Patient not taking: Reported on 02/18/2018 10/13/17   Rodell Perna A, PA-C  metoprolol tartrate (LOPRESSOR) 50 MG tablet Take 1 tablet (50 mg total) by mouth as needed. Patient not taking: Reported on 02/18/2018 07/03/17 02/18/18  Troy Sine, MD  naproxen (NAPROSYN)  250 MG tablet Take 1 tablet (250 mg total) by mouth 2 (two) times daily with a meal. Patient not taking: Reported on 02/18/2018 06/14/15   Waynetta Pean, PA-C    Family History History reviewed. No pertinent family history.  Social History Social History   Tobacco Use  . Smoking status: Never Smoker  . Smokeless tobacco: Never Used  Substance Use Topics  . Alcohol use: Yes    Comment: rarely  . Drug use: No     Allergies   Yellow jacket venom [bee venom] and Codeine   Review of Systems Review of Systems  Constitutional: Negative for chills and fever.  Respiratory: Negative for cough, chest tightness and shortness of breath.   Cardiovascular: Positive for leg swelling. Negative for chest pain and palpitations.  Gastrointestinal: Negative.   Musculoskeletal: Negative for arthralgias, back pain, gait problem and joint swelling.  Skin: Negative.   Neurological: Negative for dizziness, weakness, light-headedness and numbness.   Physical Exam Updated Vital Signs BP 114/77   Pulse 69   Temp (!) 97.5 F (36.4 C)   Resp 16   SpO2 98%   Physical Exam  Constitutional: He appears well-developed and well-nourished.  Cardiovascular: Normal rate, regular rhythm, normal heart sounds and intact distal pulses.  Pulmonary/Chest: Effort normal and breath sounds normal.  Musculoskeletal: Normal range of motion.       Left lower leg: He exhibits tenderness and swelling.  Left lower extremity  swelling > right. No pitting edema. Posterior aspect of left calf tender to palpation. Mild varicose veins present. Full active and passive ROM of lower extremities with 5/5 strength.  Neurological: He has normal strength. No sensory deficit. He exhibits normal muscle tone.  Reflex Scores:      Patellar reflexes are 2+ on the right side and 2+ on the left side.      Achilles reflexes are 2+ on the right side and 2+ on the left side. Skin: Skin is warm and intact. Capillary refill takes less than 2  seconds. No abrasion, no bruising and no ecchymosis noted. No erythema.  Nursing note and vitals reviewed.  ED Treatments / Results  Labs (all labs ordered are listed, but only abnormal results are displayed) Labs Reviewed - No data to display  EKG None  Radiology No results found.  Procedures Procedures (including critical care time)  Medications Ordered in ED Medications - No data to display   Initial Impression / Assessment and Plan / ED Course  Triage vital signs and the nursing notes have been reviewed.  Pertinent labs & imaging results that were available during care of the patient were reviewed and considered in medical decision making (see chart for details).  Patient presents for unilateral leg swelling that acutely began 1 day ago. He also endorses posterior calf pain. Risk factors for DVT include lower extremity ortho surgery and sedentary lifestyle with frequent travel. There are no associated derm abnormalities or deformities. He has no constitutional symptoms to suggest an underlying infectious or rheumatologic etiology. Highly suspicious for DVT and will order appropriate imaging to evaluate.  Clinical Course as of Feb 19 1620  Nancy Fetter Feb 18, 2018  1608 Venous U/S negative for DVT.   [GM]    Clinical Course User Index [GM] Mortis, Jonelle Sports, PA-C    Final Clinical Impressions(s) / ED Diagnoses  1. Left Lower Extremity Swelling. Negative DVT study. Varicose veins seen in left lower extremity. Likely MSK etiology. Education provided on OTC and supportive treatment for MSK pain and varicose veins.  Dispo: Home. After thorough clinical evaluation, this patient is determined to be medically stable and can be safely discharged with the previously mentioned treatment and/or outpatient follow-up/referral(s). At this time, there are no other apparent medical conditions that require further screening, evaluation or treatment.   Final diagnoses:  Left leg swelling    Varicose veins of left lower extremity, unspecified whether complicated    ED Discharge Orders    None        Junita Push 02/18/18 1621    Dorie Rank, MD 02/19/18 2124

## 2018-02-23 ENCOUNTER — Encounter: Payer: Self-pay | Admitting: Cardiovascular Disease

## 2018-02-23 ENCOUNTER — Ambulatory Visit (INDEPENDENT_AMBULATORY_CARE_PROVIDER_SITE_OTHER): Payer: BLUE CROSS/BLUE SHIELD | Admitting: Cardiovascular Disease

## 2018-02-23 VITALS — BP 106/78 | HR 70 | Ht 73.0 in | Wt 215.0 lb

## 2018-02-23 DIAGNOSIS — I8393 Asymptomatic varicose veins of bilateral lower extremities: Secondary | ICD-10-CM | POA: Diagnosis not present

## 2018-02-23 DIAGNOSIS — I872 Venous insufficiency (chronic) (peripheral): Secondary | ICD-10-CM

## 2018-02-23 DIAGNOSIS — Z8679 Personal history of other diseases of the circulatory system: Secondary | ICD-10-CM | POA: Diagnosis not present

## 2018-02-23 DIAGNOSIS — Z9889 Other specified postprocedural states: Secondary | ICD-10-CM | POA: Diagnosis not present

## 2018-02-23 DIAGNOSIS — G4733 Obstructive sleep apnea (adult) (pediatric): Secondary | ICD-10-CM

## 2018-02-23 MED ORDER — HYDROCHLOROTHIAZIDE 12.5 MG PO CAPS: 12.5000 mg | ORAL_CAPSULE | ORAL | 3 refills | Status: DC | PRN

## 2018-02-23 NOTE — Patient Instructions (Addendum)
Medication Instructions:  Take HCTZ 12.5 mg AS NEEDED for swelling  Testing/Procedures: Your physician has requested that you have a lower extremity venous duplex. This test is an ultrasound of the veins in the legs. It looks at venous blood flow that carries blood from the heart to the legs. Allow one hour for a Lower Venous exam. There are no restrictions or special instructions.  Follow-Up: Your physician wants you to follow-up in: 1 year with Dr. Claiborne Billings. You will receive a reminder letter in the mail two months in advance. If you don't receive a letter, please call our office to schedule the follow-up appointment.   Any Other Special Instructions Will Be Listed Below (If Applicable).   How to Use Compression Stockings Compression stockings are elastic socks that squeeze the legs. They help to increase blood flow to the legs, decrease swelling in the legs, and reduce the chance of developing blood clots in the lower legs. Compression stockings are often used by people who:  Are recovering from surgery.  Have poor circulation in their legs.  Are prone to getting blood clots in their legs.  Have varicose veins.  Sit or stay in bed for long periods of time.  How to use compression stockings Before you put on your compression stockings:  Make sure that they are the correct size. If you do not know your size, ask your health care provider.  Make sure that they are clean, dry, and in good condition.  Check them for rips and tears. Do not put them on if they are ripped or torn.  Put your stockings on first thing in the morning, before you get out of bed. Keep them on for as long as your health care provider advises. When you are wearing your stockings:  Keep them as smooth as possible. Do not allow them to bunch up. It is especially important to prevent the stockings from bunching up around your toes or behind your knees.  Do not roll the stockings downward and leave them rolled  down. This can decrease blood flow to your leg.  Change them right away if they become wet or dirty.  When you take off your stockings, inspect your legs and feet. Anything that does not seem normal may require medical attention. Look for:  Open sores.  Red spots.  Swelling.  Information and tips  Do not stop wearing your compression stockings without talking to your health care provider first.  Wash your stockings every day with mild detergent in cold or warm water. Do not use bleach. Air-dry your stockings or dry them in a clothes dryer on low heat.  Replace your stockings every 3-6 months.  If skin moisturizing is part of your treatment plan, apply lotion or cream at night so that your skin will be dry when you put on the stockings in the morning. It is harder to put the stockings on when you have lotion on your legs or feet. Contact a health care provider if: Remove your stockings and seek medical care if:  You have a feeling of pins and needles in your feet or legs.  You have any new changes in your skin.  You have skin lesions that are getting worse.  You have swelling or pain that is getting worse.  Get help right away if:  You have numbness or tingling in your lower legs that does not get better right after you take the stockings off.  Your toes or feet become cold and  blue.  You develop open sores or red spots on your legs that do not go away.  You see or feel a warm spot on your leg.  You have new swelling or soreness in your leg.  You are short of breath or you have chest pain for no reason.  You have a rapid or irregular heartbeat.  You feel light-headed or dizzy. This information is not intended to replace advice given to you by your health care provider. Make sure you discuss any questions you have with your health care provider. Document Released: 04/03/2009 Document Revised: 11/04/2015 Document Reviewed: 05/14/2014 Elsevier Interactive Patient  Education  [employer].    If you need a refill on your cardiac medications before your next appointment, please call your pharmacy.

## 2018-02-23 NOTE — Progress Notes (Signed)
Cardiology Office Note    Date:  02/23/2018   ID:  Mathew Phillips, DOB 02/14/1961, MRN [id number]  PCP:  Patient, No Pcp Per  Cardiologist:  Mathew Majestic, MD   Chief Complaint  Patient presents with  . Follow-up    6 months    History of Present Illness:  Mathew Phillips is a 57 y.o. male who presents to the office today for an 56-monthfollow-up cardiology evaluation after establishing with his is now cardiology care with [place]  [name] history of atrial tachycardia and in March 2014 [facility]  He had developed an episode of wide complex tachycardia which was picked up on outpatient monitoring in 2013 that was presumed to be VT.  He underwent cardiac catheterization, echo, and EPS as well as cardiac MRI.  Catheterization showed an EF of 60%; TEE revealed a normal EF.  EPS did not reveal inducible arrhythmia and he underwent successful cardioversion to sinus rhythm.  A cardiac MRI did not reveal infiltrative disease, and there was no evidence for RV dysplasia.  He was discharged with flecainide as well as metoprolol.   He underwent [facility] for atrial fibrillation.  Subsequently, he has stabilized and has not had any recurrent awareness of atrial fibrillation.  He tells me that he was diagnosed with obstructive sleep apnea one year ago was started on CPAP.  He self discontinued Toprol 6 months ago.  He denies any recent palpitations.  He denies any change in exercise tolerance.  He denies PND, orthopnea.   I recommended that he undergo a 2D echo Doppler study which was done on July 10, 2017.  This revealed normal LV systolic and diastolic function.  EF is 55 to 60%.  Atrial size was normal.  He had normal pulmonary pressures.  Over the past several months he has continued to do well.  I have given him a prescription for metoprolol tartrate to take on a as  needed basis.  He has not had any palpitations and has not had to use this.  He has obstructive sleep apnea, recently has been obtaining his supplies online.  He uses a fullface mask and admits to 100% compliance.  Recently, he has had issues of worsening lower leg edema.  He has varicose veins.  This past weekend apparently he noticed more leg swelling.  He was advised to go to the ER.  Lower extremity Doppler study was negative for DVT.  He presents for evaluation.   Past Medical History:  Diagnosis Date  . Atrial fibrillation ([facility]     Past Surgical History:  Procedure Laterality Date  . ATRIAL FIBRILLATION ABLATION    . KNEE SURGERY  both  . SHOULDER SURGERY  both  . WRIST SURGERY Left     Current Medications: Outpatient Medications Prior to Visit  Medication Sig Dispense Refill  . metoprolol tartrate (LOPRESSOR) 50 MG tablet Take 1 tablet (50 mg total) by mouth as needed. 30 tablet 3  . HYDROcodone-acetaminophen (NORCO/VICODIN) 5-325 MG tablet Take 1 tablet by mouth every 6 (six) hours as needed for severe pain. (Patient not taking: Reported on 02/18/2018) 4 tablet 0  . naproxen (NAPROSYN) 250 MG tablet Take 1 tablet (250 mg total) by mouth 2 (two) times daily with a meal. (Patient not taking: Reported on 02/18/2018) 30 tablet 0  . naproxen sodium (ALEVE) 220 MG tablet  Take 440 mg by mouth daily as needed (for pain or headache).     No facility-administered medications prior to visit.      Allergies:   Yellow jacket venom [bee venom] and Codeine   Social History   Socioeconomic History  . Marital status: Married    Spouse name: Not on file  . Number of children: Not on file  . Years of education: Not on file  . Highest education level: Not on file  Occupational History  . Not on file  Social Needs  . Financial resource strain: Not on file  . Food insecurity:    Worry: Not on file    Inability: Not on file  . Transportation needs:    Medical: Not on file    Non-medical:  Not on file  Tobacco Use  . Smoking status: Never Smoker  . Smokeless tobacco: Never Used  Substance and Sexual Activity  . Alcohol use: Yes    Comment: rarely  . Drug use: No  . Sexual activity: Not on file  Lifestyle  . Physical activity:    Days per week: Not on file    Minutes per session: Not on file  . Stress: Not on file  Relationships  . Social connections:    Talks on phone: Not on file    Gets together: Not on file    Attends religious service: Not on file    Active member of club or organization: Not on file    Attends meetings of clubs or organizations: Not on file    Relationship status: Not on file  Other Topics Concern  . Not on file  Social History Narrative  . Not on file    Social history is notable in that he is married for 28 years.  He has 2 children ages 16 and 77.  There is no history of tobacco use.  He drinks wine on occasion.  He exercises at least 3-5 days per week doing cardio and weights.  He works for [employer] as a Psychologist, forensic  Family History:  The patient'Mathew family history is not on file.  Family history is notable in that his mother died at age 56 and had hypertension, and COPD.  Father died at age 51 with lung cancer.  He was a heavy smoker.  He has a brother who is living at age 80 and has COPD.  He has 4 sisters.  2 sisters have cancer.  There is history of hypertension and 3 of his sisters.  ROS General: Negative; No fevers, chills, or night sweats;  HEENT: Negative; No changes in vision or hearing, sinus congestion, difficulty swallowing Pulmonary: Negative; No cough, wheezing, shortness of breath, hemoptysis Cardiovascular: See history of present illness GI: Negative; No nausea, vomiting, diarrhea, or abdominal pain GU: Negative; No dysuria, hematuria, or difficulty voiding Musculoskeletal: Negative; no myalgias, joint pain, or weakness Hematologic/Oncology: Negative; no easy bruising, bleeding Endocrine:  Negative; no heat/cold intolerance; no diabetes Neuro: Negative; no changes in balance, headaches Skin: Negative; No rashes or skin lesions Psychiatric: Negative; No behavioral problems, depression Sleep: Negative; No snoring, daytime sleepiness, hypersomnolence, bruxism, restless legs, hypnogognic hallucinations, no cataplexy Other comprehensive 14 point system review is negative.   PHYSICAL EXAM:   VS:  BP 106/78 (BP Location: Left Arm, Patient Position: Sitting, Cuff Size: Normal)   Pulse 70   Ht 6' 1"  (1.854 m)   Wt 215 lb (97.5 kg)   BMI 28.37 kg/m  Repeat blood pressure by me was 110/72  Wt Readings from Last 3 Encounters:  02/23/18 215 lb (97.5 kg)  10/13/17 214 lb (97.1 kg)  07/03/17 233 lb 12.8 oz (106.1 kg)    General: Alert, oriented, no distress.  Skin: normal turgor, no rashes, warm and dry HEENT: Normocephalic, atraumatic. Pupils equal round and reactive to light; sclera anicteric; extraocular muscles intact;  Nose without nasal septal hypertrophy Mouth/Parynx benign; Mallinpatti scale 3 Neck: No JVD, no carotid bruits; normal carotid upstroke Lungs: clear to ausculatation and percussion; no wheezing or rales Chest wall: Moderate pectus excavatum. Heart: PMI not displaced, RRR, s1 s2 normal, 1/6 systolic murmur, no diastolic murmur, no rubs, gallops, thrills, or heaves Abdomen: soft, nontender; no hepatosplenomehaly, BS+; abdominal aorta nontender and not dilated by palpation. Back: no CVA tenderness Pulses 2+ Musculoskeletal: full range of motion, normal strength, no joint deformities Extremities: Bilateral varicose veins, most prominent in the left lower extremity with prominent venous system of the proximal medial thigh and below the knee.  Trace edema; Homan'Mathew sign negative  Neurologic: grossly nonfocal; Cranial nerves grossly wnl Psychologic: Normal mood and affect   Studies/Labs Reviewed:   EKG:  EKG is ordered today.  ECG (independently read by me):  Normal sinus rhythm at 70 bpm.  Mild RV conduction delay.  Small Q waves in lead III and aVF, nondiagnostic.  Normal intervals.  No ectopy.  January 2019 ECG (independently read by me): Normal sinus rhythm at 66 bpm.  Incomplete right bundle branch block.  Normal intervals.  No ectopy.  Recent Labs: BMP Latest Ref Rng & Units 07/04/2017  Glucose 65 - 99 mg/dL 94  BUN 6 - 24 mg/dL 13  Creatinine 0.76 - 1.27 mg/dL 0.90  BUN/Creat Ratio 9 - 20 14  Sodium 134 - 144 mmol/L 146(H)  Potassium 3.5 - 5.2 mmol/L 4.3  Chloride 96 - 106 mmol/L 109(H)  CO2 20 - 29 mmol/L 23  Calcium 8.7 - 10.2 mg/dL 9.0     Hepatic Function Latest Ref Rng & Units 07/04/2017  Total Protein 6.0 - 8.5 g/dL 6.8  Albumin 3.5 - 5.5 g/dL 4.3  AST 0 - 40 IU/L 15  ALT 0 - 44 IU/L 22  Alk Phosphatase 39 - 117 IU/L 83  Total Bilirubin 0.0 - 1.2 mg/dL 0.3    CBC Latest Ref Rng & Units 07/04/2017  WBC 3.4 - 10.8 x10E3/uL 6.5  Hemoglobin 13.0 - 17.7 g/dL 13.9  Hematocrit 37.5 - 51.0 % 40.1  Platelets 150 - 379 x10E3/uL 302   Lab Results  Component Value Date   MCV 87 07/04/2017   Lab Results  Component Value Date   TSH 0.898 07/04/2017   No results found for: HGBA1C   BNP No results found for: BNP  ProBNP No results found for: PROBNP   Lipid Panel     Component Value Date/Time   CHOL 146 07/04/2017 0826   TRIG 51 07/04/2017 0826   HDL 40 07/04/2017 0826   CHOLHDL 3.7 07/04/2017 0826   LDLCALC 96 07/04/2017 0826     RADIOLOGY: No results found.   Additional studies/ records that were reviewed today include:  I personally reviewed the patient'Mathew medical records from 2013 , his echo Doppler study as well as his office records from [facility] specialists   ASSESSMENT:    1. Varicose veins of both lower extremities, unspecified whether complicated   2. Venous insufficiency   3. Mathew/P ablation of atrial fibrillation   4. OSA (obstructive  sleep apnea)on CPAP     PLAN:  Mr. Kerby Borner  is a 57 year old gentleman who underwent ablation for atrial fibrillation in 2013 in [place], [place].  At the time he was also felt to have episodes of wide-complex tachycardia with a presumptive diagnosis of ventricular tachycardia.  At EPS he was noninducible.  A cardiac MRI did not demonstrate any infiltrative disease, and there was no evidence for RV dysplasia.  Since undergoing his ablation.  He has been maintaining sinus rhythm.  He had stopped taking Toprol-XL as well as his primary antiarrhythmic therapy.  I reviewed his most recent echo Doppler study which showed both normal systolic and diastolic function without significant valvular pathology and with normal atrial dimensions bilaterally.  He is maintaining sinus rhythm.  He has a prescription for metoprolol to take on a as needed basis but has not required this.  Since I last saw him, reestablishing with a DME company locally for his supplies he has been getting his CPAP supplies online which he states is at reduced cost.  He has a [employer] CPAP unit and he believes this is slightly over 57 years old.  On exam today he has significant bilateral lower extremity varicosities left greater than right.  I suspect he may have significant venous insufficiency.  I have recommended he undergo a lower extremity venous insufficiency evaluation.  I have suggested support stockings full length to include the thigh for benefit.  I am giving her prescription for HCTZ 12.5 mg to take on a as needed basis for edema.  He remains active.  His work requires significant travel.  I strongly recommended that the support stockings always be worn on his long airplane trips.  I will contact him regarding his venous insufficiency study.  If significant, it may be worthwhile to refer to [facility].  I will see him in 1 year for reevaluation.  Medication Adjustments/Labs and Tests Ordered: Current medicines are reviewed at length with the patient today.   Concerns regarding medicines are outlined above.  Medication changes, Labs and Tests ordered today are listed in the Patient Instructions below. Patient Instructions  Medication Instructions:  Take HCTZ 12.5 mg AS NEEDED for swelling  Testing/Procedures: Your physician has requested that you have a lower extremity venous duplex. This test is an ultrasound of the veins in the legs. It looks at venous blood flow that carries blood from the heart to the legs. Allow one hour for a Lower Venous exam. There are no restrictions or special instructions.  Follow-Up: Your physician wants you to follow-up in: 1 year with Dr. Claiborne Billings. You will receive a reminder letter in the mail two months in advance. If you don't receive a letter, please call our office to schedule the follow-up appointment.   Any Other Special Instructions Will Be Listed Below (If Applicable).   How to Use Compression Stockings Compression stockings are elastic socks that squeeze the legs. They help to increase blood flow to the legs, decrease swelling in the legs, and reduce the chance of developing blood clots in the lower legs. Compression stockings are often used by people who:  Are recovering from surgery.  Have poor circulation in their legs.  Are prone to getting blood clots in their legs.  Have varicose veins.  Sit or stay in bed for long periods of time.  How to use compression stockings Before you put on your compression stockings:  Make sure that they are the correct size. If you do  not know your size, ask your health care provider.  Make sure that they are clean, dry, and in good condition.  Check them for rips and tears. Do not put them on if they are ripped or torn.  Put your stockings on first thing in the morning, before you get out of bed. Keep them on for as long as your health care provider advises. When you are wearing your stockings:  Keep them as smooth as possible. Do not allow them to bunch up. It  is especially important to prevent the stockings from bunching up around your toes or behind your knees.  Do not roll the stockings downward and leave them rolled down. This can decrease blood flow to your leg.  Change them right away if they become wet or dirty.  When you take off your stockings, inspect your legs and feet. Anything that does not seem normal may require medical attention. Look for:  Open sores.  Red spots.  Swelling.  Information and tips  Do not stop wearing your compression stockings without talking to your health care provider first.  Wash your stockings every day with mild detergent in cold or warm water. Do not use bleach. Air-dry your stockings or dry them in a clothes dryer on low heat.  Replace your stockings every 3-6 months.  If skin moisturizing is part of your treatment plan, apply lotion or cream at night so that your skin will be dry when you put on the stockings in the morning. It is harder to put the stockings on when you have lotion on your legs or feet. Contact a health care provider if: Remove your stockings and seek medical care if:  You have a feeling of pins and needles in your feet or legs.  You have any new changes in your skin.  You have skin lesions that are getting worse.  You have swelling or pain that is getting worse.  Get help right away if:  You have numbness or tingling in your lower legs that does not get better right after you take the stockings off.  Your toes or feet become cold and blue.  You develop open sores or red spots on your legs that do not go away.  You see or feel a warm spot on your leg.  You have new swelling or soreness in your leg.  You are short of breath or you have chest pain for no reason.  You have a rapid or irregular heartbeat.  You feel light-headed or dizzy. This information is not intended to replace advice given to you by your health care provider. Make sure you discuss any questions  you have with your health care provider. Document Released: 04/03/2009 Document Revised: 11/04/2015 Document Reviewed: 05/14/2014 Elsevier Interactive Patient Education  [employer].    If you need a refill on your cardiac medications before your next appointment, please call your pharmacy.      Signed, Mathew Majestic, MD  02/23/2018 12:11 PM    [facility] [address] [place], [place], [place]  [address] Phone: [phone]

## 2018-02-27 ENCOUNTER — Ambulatory Visit (HOSPITAL_COMMUNITY)
Admission: RE | Admit: 2018-02-27 | Discharge: 2018-02-27 | Disposition: A | Discharge status: Home / Self Care | Payer: BLUE CROSS/BLUE SHIELD | Source: Ambulatory Visit | Attending: Cardiovascular Disease | Admitting: Cardiovascular Disease

## 2018-02-27 DIAGNOSIS — I8393 Asymptomatic varicose veins of bilateral lower extremities: Secondary | ICD-10-CM | POA: Diagnosis present

## 2018-02-27 DIAGNOSIS — I872 Venous insufficiency (chronic) (peripheral): Secondary | ICD-10-CM | POA: Insufficient documentation

## 2018-02-27 NOTE — Addendum Note (Signed)
Addended by: Venetia Maxon on: 02/27/2018 03:05 PM   Modules accepted: Orders

## 2018-03-06 ENCOUNTER — Ambulatory Visit: Payer: BLUE CROSS/BLUE SHIELD | Admitting: Cardiovascular Disease

## 2018-03-07 ENCOUNTER — Other Ambulatory Visit: Payer: Self-pay | Admitting: *Deleted

## 2018-03-07 DIAGNOSIS — I872 Venous insufficiency (chronic) (peripheral): Secondary | ICD-10-CM

## 2018-03-20 ENCOUNTER — Ambulatory Visit (INDEPENDENT_AMBULATORY_CARE_PROVIDER_SITE_OTHER): Payer: BLUE CROSS/BLUE SHIELD | Admitting: Family Medicine

## 2018-03-20 ENCOUNTER — Encounter (INDEPENDENT_AMBULATORY_CARE_PROVIDER_SITE_OTHER): Payer: Self-pay | Admitting: Family Medicine

## 2018-03-20 VITALS — BP 108/74 | HR 66 | Ht 73.0 in | Wt 215.0 lb

## 2018-03-20 DIAGNOSIS — S86112A Strain of other muscle(s) and tendon(s) of posterior muscle group at lower leg level, left leg, initial encounter: Secondary | ICD-10-CM

## 2018-03-20 DIAGNOSIS — G8929 Other chronic pain: Secondary | ICD-10-CM

## 2018-03-20 DIAGNOSIS — M25562 Pain in left knee: Secondary | ICD-10-CM

## 2018-03-20 NOTE — Progress Notes (Signed)
  Mathew Phillips - 57 y.o. male MRN [id number]  Date of birth: 1961-03-08    SUBJECTIVE:      Chief Complaint:/ HPI:  57 year old male with posterior lateral left knee pain for proximally 2 months.  Patient reports no specific mechanism of injury.  His pain is worse when performing toe raise or standing up from a squatting position.  He denies any localized swelling, erythema, bruising.  He denies any numbness or tingling in his lower extremity.  He has a history of left knee pain has arthroscopic surgery in the past.  He denies any feeling of locking or instability.  He has been using Aleve, rest, elevation with no significant improvement over the past few months   ROS:     See HPI  PERTINENT  PMH / PSH FH / / SH:  Past Medical, Surgical, Social, and Family History Reviewed & Updated in the EMR.   OBJECTIVE: BP 108/74 (BP Location: Left Arm, Patient Position: Sitting)   Pulse 66   Ht 6\' 1"  (1.854 m)   Wt 215 lb (97.5 kg)   BMI 28.37 kg/m   Physical Exam:  Vital signs are reviewed.  GEN: Alert and oriented, NAD Pulm: Breathing unlabored PSY: normal mood, congruent affect  MSK: Left Knee: - Inspection: no gross deformity. No swelling/effusion, erythema or bruising. Skin intact - Palpation: No joint line tenderness.  No tenderness over the proximal fibular head.  There is tenderness over the posterior aspect of the knee at the proximal lateral gastroc.  No tenderness of the biceps femoris tendon - ROM: full active ROM with flexion and extension in knee and hip.  Pain reproduced with slight flexion of the knee and performing toe raise. - Strength: 5/5 strength - Neuro/vasc: NV intact - Special Tests: - LIGAMENTS: negative anterior and posterior drawer, negative Lachman's, no MCL or LCL laxity  -- MENISCUS: negative McMurray's, negative Thessaly  -- PF JOINT: nml patellar mobility bilaterally without apprehension  Hips: normal ROM without pain    ASSESSMENT & PLAN:  1. Left  knee lateral head gastrocnemius tendinopathy/myofascial pain -Trial of physical therapy.  X-rays and/or MRI scan if not improving.

## 2018-03-20 NOTE — Progress Notes (Signed)
I saw and examined the patient with Dr. Okey Dupre and agree with assessment and plan as outlined.  Lateral head gastroc tendinopathy/myofascial pain.  Try PT at [facility].  X-Rays/MRI if no improvement.

## 2018-03-29 ENCOUNTER — Ambulatory Visit (INDEPENDENT_AMBULATORY_CARE_PROVIDER_SITE_OTHER): Payer: BLUE CROSS/BLUE SHIELD | Admitting: Vascular Surgery

## 2018-03-29 ENCOUNTER — Encounter: Payer: Self-pay | Admitting: Vascular Surgery

## 2018-03-29 VITALS — BP 122/75 | HR 75 | Temp 97.3°F | Resp 18 | Ht 73.0 in | Wt 215.0 lb

## 2018-03-29 DIAGNOSIS — I83812 Varicose veins of left lower extremities with pain: Secondary | ICD-10-CM | POA: Diagnosis not present

## 2018-03-29 DIAGNOSIS — I868 Varicose veins of other specified sites: Secondary | ICD-10-CM

## 2018-03-29 NOTE — Progress Notes (Signed)
REASON FOR CONSULT:    Varicose veins.  The consult is requested by Dr. Ellouise Newer.  HPI:   Mathew Phillips is a pleasant 57 y.o. 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  He states that he noted some spider veins in his lower left leg about 20 years ago.  He is gradually developed significantly enlarged varicose veins along the medial aspect of his left thigh and left calf which is gotten worse in the last 6 months especially.  He experiences significant aching pain heaviness and tiredness in his legs.  His symptoms are aggravated by standing and sitting and relieved with elevation.  He has worn the compression stockings without much relief.  He does take ibuprofen as needed for pain.  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  He also does international travel.  He is unaware of any previous history of DVT or phlebitis.  Past Medical History:  Diagnosis Date  . Atrial fibrillation ([place])     History reviewed. No pertinent family history.  SOCIAL HISTORY: Social History   Socioeconomic History  . Marital status: Married    Spouse name: Not on file  . Number of children: Not on file  . Years of education: Not on file  . Highest education level: Not on file  Occupational History  . Not on file  Social Needs  . Financial resource strain: Not on file  . Food insecurity:    Worry: Not on file    Inability: Not on file  . Transportation needs:    Medical: Not on file    Non-medical: Not on file  Tobacco Use  . Smoking status: Never Smoker  . Smokeless tobacco: Never Used  Substance and Sexual Activity  . Alcohol use: Yes    Comment: rarely  . Drug use: No  . Sexual activity: Not on file  Lifestyle  . Physical activity:    Days per week: Not on file    Minutes per session: Not on file  . Stress: Not on file  Relationships  . Social connections:    Talks on phone: Not on file    Gets  together: Not on file    Attends religious service: Not on file    Active member of club or organization: Not on file    Attends meetings of clubs or organizations: Not on file    Relationship status: Not on file  . Intimate partner violence:    Fear of current or ex partner: Not on file    Emotionally abused: Not on file    Physically abused: Not on file    Forced sexual activity: Not on file  Other Topics Concern  . Not on file  Social History Narrative  . Not on file    Allergies  Allergen Reactions  . Yellow Jacket Venom [Bee Venom] Anaphylaxis, Hives and Swelling  . Codeine Hives    Current Outpatient Medications  Medication Sig Dispense Refill  . hydrochlorothiazide (MICROZIDE) 12.5 MG capsule Take 1 capsule (12.5 mg total) by mouth as needed (swelling). (Patient not taking: Reported on 03/20/2018) 30 capsule 3  . metoprolol tartrate (LOPRESSOR) 50 MG tablet Take 1 tablet (50 mg total) by mouth as needed. 30 tablet 3   No current facility-administered medications for this visit.     REVIEW OF SYSTEMS:  [X]  denotes positive finding, [ ]  denotes negative finding Cardiac  Comments:  Chest pain or chest pressure:    Shortness of breath upon exertion:    Short of breath when lying flat:    Irregular heart rhythm:        Vascular    Pain in calf, thigh, or hip brought on by ambulation: x   Pain in feet at night that wakes you up from your sleep:     Blood clot in your veins:    Leg swelling:  x       Pulmonary    Oxygen at home:    Productive cough:     Wheezing:         Neurologic    Sudden weakness in arms or legs:     Sudden numbness in arms or legs:     Sudden onset of difficulty speaking or slurred speech:    Temporary loss of vision in one eye:     Problems with dizziness:         Gastrointestinal    Blood in stool:     Vomited blood:         Genitourinary    Burning when urinating:     Blood in urine:        Psychiatric    Major depression:          Hematologic    Bleeding problems:    Problems with blood clotting too easily:        Skin    Rashes or ulcers:        Constitutional    Fever or chills:     PHYSICAL EXAM:   Vitals:   03/29/18 1314  BP: 122/75  Pulse: 75  Resp: 18  Temp: (!) 97.3 F (36.3 C)  SpO2: 98%  Weight: 215 lb (97.5 kg)  Height: 6\' 1"  (1.854 m)    GENERAL: The patient is a well-nourished male, in no acute distress. The vital signs are documented above. CARDIAC: There is a regular rate and rhythm.  VASCULAR: I do not detect carotid bruits. He has palpable pedal pulses bilaterally. He has very large dilated varicose veins along the medial aspect of his left thigh and left calf.  He has mild left lower extremity swelling.  Currently there is no significant hyperpigmentation. I did look at the great saphenous vein with the SonoSite and the vein is significantly enlarged from the mid thigh to the groin.  In the mid thigh the saphenous vein feeds this large cluster of varicose veins. PULMONARY: There is good air exchange bilaterally without wheezing or rales. ABDOMEN: Soft and non-tender with normal pitched bowel sounds.  MUSCULOSKELETAL: There are no major deformities or cyanosis. NEUROLOGIC: No focal weakness or paresthesias are detected. SKIN: There are no ulcers or rashes noted. PSYCHIATRIC: The patient has a normal affect.  DATA:    VENOUS DUPLEX: I have reviewed the venous duplex of the left lower extremity that was done on 02/18/2018.  This showed no evidence of DVT and no evidence of superficial venous thrombosis.  LEFT LOWER EXTREMITY VENOUS REFLUX STUDY: I have reviewed the reflux study of the left lower extremity that was done on 02/27/2018.  This showed no evidence of deep venous thrombosis.  There was no significant reflux in the deep venous system.  There was significant reflux in the left great saphenous vein from the proximal calf to the groin.  The vein was significantly dilated from the  mid thigh up to the groin.  ASSESSMENT & PLAN:   CHRONIC VENOUS INSUFFICIENCY:  This patient has CEAP clinical class III venous disease.  I have discussed with him the importance of intermittent leg elevation the proper positioning for this.  I have written a prescription for thigh-high compression stockings with a gradient of 20 to 30 mmHg.  I have encouraged him to avoid prolonged sitting and standing.  We discussed the importance of exercise.  I will see him back in 3 months.  If his symptoms have not improved I think he would be a good candidate for endovenous laser ablation of the left great saphenous vein from the mid thigh to the groin with greater than 20 stab phlebectomies.  I have discussed with him the indications for the procedure and the potential complications.   Deitra Mayo [facility] [phone]

## 2018-06-21 ENCOUNTER — Ambulatory Visit (INDEPENDENT_AMBULATORY_CARE_PROVIDER_SITE_OTHER): Payer: BLUE CROSS/BLUE SHIELD | Admitting: Vascular Surgery

## 2018-06-21 ENCOUNTER — Encounter: Payer: Self-pay | Admitting: Vascular Surgery

## 2018-06-21 VITALS — BP 127/76 | HR 70 | Temp 97.0°F | Resp 18 | Ht 73.0 in | Wt 220.0 lb

## 2018-06-21 DIAGNOSIS — I83812 Varicose veins of left lower extremities with pain: Secondary | ICD-10-CM

## 2018-06-21 NOTE — Progress Notes (Signed)
Patient name: Mathew Phillips MRN: [id number] DOB: [birth year] Sex: male  REASON FOR VISIT:   Follow-up of painful varicose veins of the left lower extremity.  HPI:   Mathew Phillips is a pleasant 58 y.o. male who I last saw on 03/29/2018 with large varicose veins in the left lower extremity.  These veins were associated with significant pain.  He had been wearing compression stockings without much relief and taking ibuprofen as needed for pain.  Of note his job requires him to sit for prolonged periods of time and he also does a lot of international travel.  On exam he had very large dilated varicose veins along the medial aspect of his left thigh and left calf.  I looked at the great saphenous vein with the SonoSite and the great saphenous vein was significantly large from the mid thigh to the groin.  In the mid thigh the saphenous vein feeds this large cluster of varicose veins.  I discussed with him the importance of intermittent leg elevation the proper positioning for this.  I wrote him a prescription for thigh-high compression stockings with a gradient of 20 to 30 mmHg.  I felt that if his symptoms did not improve he might be a candidate for endovenous laser ablation of the left great saphenous vein from the mid thigh to the groin and greater than 20 stab phlebectomies.  He comes in for 6-month follow-up visit.  Since I saw him last he has been wearing his thigh-high compression stockings.  These helped some but he is continuing to have significant pain in his left leg which is aggravated by standing and sitting and relieved somewhat with elevation.  He travels quite a bit overseas.  He recently had an episode in which she had some erythema and pain in his distal left leg and I am sure that he had phlebitis in this area.  He does try to get up on these long flights and walk quite a bit.  He wears his compression stockings when he flies also.  Current Outpatient Medications  Medication Sig  Dispense Refill  . hydrochlorothiazide (MICROZIDE) 12.5 MG capsule Take 1 capsule (12.5 mg total) by mouth as needed (swelling). (Patient not taking: Reported on 03/20/2018) 30 capsule 3  . metoprolol tartrate (LOPRESSOR) 50 MG tablet Take 1 tablet (50 mg total) by mouth as needed. (Patient not taking: Reported on 06/21/2018) 30 tablet 3   No current facility-administered medications for this visit.     REVIEW OF SYSTEMS:  [X]  denotes positive finding, [ ]  denotes negative finding Vascular    Leg swelling    Cardiac    Chest pain or chest pressure:    Shortness of breath upon exertion:    Short of breath when lying flat:    Irregular heart rhythm:    Constitutional    Fever or chills:     PHYSICAL EXAM:   Vitals:   06/21/18 1605  BP: 127/76  Pulse: 70  Resp: 18  Temp: (!) 97 F (36.1 C)  TempSrc: Oral  SpO2: 99%  Weight: 220 lb (99.8 kg)  Height: 6\' 1"  (1.854 m)    GENERAL: The patient is a well-nourished male, in no acute distress. The vital signs are documented above. CARDIOVASCULAR: There is a regular rate and rhythm. PULMONARY: There is good air exchange bilaterally without wheezing or rales. VASCULAR: This patient has large dilated varicose veins along his medial left thigh and left calf.  He has  in the  left leg. I did look at the great saphenous vein again with the SonoSite.  This is markedly dilated in the proximal thigh.  I would likely cannulate the vein in the mid thigh just above where it feeds these large varicose veins.  Below that the great saphenous vein is smaller.  DATA:   No new data  MEDICAL ISSUES:   CHRONIC VENOUS INSUFFICIENCY: This patient has CEAP C-3 disease.  He is failed conservative measures including leg elevation, compression stockings, and ibuprofen.  I think he is a good candidate for endovenous laser ablation of the left great saphenous vein with greater than 20 stab phlebectomies. I have discussed the indications for endovenous laser  ablation of the left GSV, that is to lower the pressure in the veins and potentially help relieve the symptoms from venous hypertension. I have also discussed alternative options including conservative treatment with leg elevation, compression therapy, exercise, avoiding prolonged sitting and standing, and weight management. I have discussed the potential complications of the procedure, including, but not limited to: bleeding, bruising, leg swelling, nerve injury, skin burns, significant pain from phlebitis, deep venous thrombosis, or failure of the vein to close.  I have also explained that venous insufficiency is a chronic disease, and that the patient is at risk for recurrent varicose veins in the future.  All of the patient's questions were encouraged and answered. They are agreeable to proceed.   I have discussed with the patient the indications for stab phlebectomy.  I have explained to the patient that that will have small scars from the stab incisions.  I explained that the other risks include leg swelling, bruising, bleeding, and phlebitis.  All the patient's questions were encouraged and answered and they are agreeable to proceed.   Deitra Mayo [facility] [place] [phone]

## 2018-06-26 ENCOUNTER — Other Ambulatory Visit: Payer: Self-pay | Admitting: *Deleted

## 2018-06-26 DIAGNOSIS — I83812 Varicose veins of left lower extremities with pain: Secondary | ICD-10-CM

## 2018-08-09 ENCOUNTER — Encounter: Payer: Self-pay | Admitting: Vascular Surgery

## 2018-08-09 ENCOUNTER — Other Ambulatory Visit: Payer: Self-pay

## 2018-08-09 ENCOUNTER — Ambulatory Visit (INDEPENDENT_AMBULATORY_CARE_PROVIDER_SITE_OTHER): Payer: BLUE CROSS/BLUE SHIELD | Admitting: Vascular Surgery

## 2018-08-09 VITALS — BP 111/65 | HR 82 | Temp 98.1°F | Resp 18 | Ht 73.0 in | Wt 220.0 lb

## 2018-08-09 DIAGNOSIS — I83812 Varicose veins of left lower extremities with pain: Secondary | ICD-10-CM | POA: Diagnosis not present

## 2018-08-09 HISTORY — PX: ENDOVENOUS ABLATION SAPHENOUS VEIN W/ LASER: SUR449

## 2018-08-09 NOTE — Progress Notes (Signed)
Patient name: Mathew Phillips MRN: [id number] DOB: 1960/10/11 Sex: male  REASON FOR VISIT:   For laser ablation of the left great saphenous vein with greater than 20 stab phlebectomies.  HPI:   Mathew Phillips is a pleasant 58 y.o. male who I last saw on 06/21/2018.  He has painful varicose veins of the left lower extremity which have failed conservative treatment.  The saphenous vein is markedly dilated in the proximal thigh.  I felt that I could cannulate the vein in the mid thigh just above where it feeds a large cluster of varicose veins.  Below that the great saphenous vein is smaller.  Current Outpatient Medications  Medication Sig Dispense Refill  . hydrochlorothiazide (MICROZIDE) 12.5 MG capsule Take 1 capsule (12.5 mg total) by mouth as needed (swelling). (Patient not taking: Reported on 03/20/2018) 30 capsule 3  . metoprolol tartrate (LOPRESSOR) 50 MG tablet Take 1 tablet (50 mg total) by mouth as needed. (Patient not taking: Reported on 06/21/2018) 30 tablet 3   No current facility-administered medications for this visit.     REVIEW OF SYSTEMS:  [X]  denotes positive finding, [ ]  denotes negative finding Vascular    Leg swelling    Cardiac    Chest pain or chest pressure:    Shortness of breath upon exertion:    Short of breath when lying flat:    Irregular heart rhythm:    Constitutional    Fever or chills:     PHYSICAL EXAM:   Vitals:   08/09/18 0836  BP: 111/65  Pulse: 82  Resp: 18  Temp: 98.1 F (36.7 C)  TempSrc: Oral  SpO2: 97%  Weight: 220 lb (99.8 kg)  Height: 6\' 1"  (1.854 m)    GENERAL: The patient is a well-nourished male, in no acute distress. The vital signs are documented above.   DATA:   No new data  MEDICAL ISSUES:   LASER ABLATION LEFT GREAT SAPHENOUS VEIN WITH GREATER THAN 20 STAB PHLEBECTOMIES: The patient was brought to the exam room.  I marked the large dilated varicose veins in the left lower extremity with the patient standing.  The  patient was then placed supine.  I interrogated the great saphenous vein with the SonoSite and I felt the best point to cannulate this was in the mid thigh above the takeoff of the large tributaries.  The left lower extremity was prepped and draped in usual sterile fashion.  After the skin was anesthetized with functional lidocaine, under ultrasound guidance, the left great saphenous vein was cannulated with a micropuncture needle and a micropuncture sheath introduced over a wire.  The J-wire was advanced up to the saphenofemoral junction just below the takeoff of the superficial epigastric vein.  The sheath was advanced over the wire and the wire and dilator removed.  The sheath was positioned just below the takeoff of the superficial epigastric vein.  The laser fiber was advanced to the end of the sheath and then the sheath retracted.  The skin was anesthetized with pumps and lidocaine at the cannulation site and then the entire saphenous canal was anesthetized with tumescent anesthesia up to the saphenofemoral junction.  The patient was then placed in Trendelenburg.  With compression at the saphenofemoral junction the great saphenous vein was ablated with a pullback rate of approximately 1 to 2 mm/s.  A total of 1417 J of energy were administered to the mid thigh.  Attention was then turned to stab phlebectomies.  Tumescent anesthesia  was administered to all the marked areas.  Using small stab incisions with an 11 blade the veins were then retracted into the wound with a hook and then gently removed with blunt dissection using hemostats.  Pressure was held for hemostasis.  Proximately 25 stabs were made.  Sterile dressing was applied.  A pressure dressing was applied.  The patient tolerated procedure well.  He will return in 1 week for follow-up duplex.  Deitra Mayo [facility] [phone]

## 2018-08-09 NOTE — Progress Notes (Signed)
     Laser Ablation Procedure    Date: 08/09/2018   Mathew Phillips DOB:[birth year]  Consent signed: Yes    Surgeon:  Dr. Deitra Mayo  Procedure: Laser Ablation: left Greater Saphenous Vein  BP 111/65 (BP Location: Left Arm, Patient Position: Sitting, Cuff Size: Normal)   Pulse 82   Temp 98.1 F (36.7 C) (Oral)   Resp 18   Ht 6\' 1"  (1.854 m)   Wt 220 lb (99.8 kg)   SpO2 97%   BMI 29.03 kg/m   Tumescent Anesthesia: 500 cc 0.9% NaCl with 50 cc Lidocaine HCL 1% and 15 cc 8.4% NaHCO3  Local Anesthesia: 12 cc Lidocaine HCL and NaHCO3 (ratio 2:1)  15 watts continuous mode        Total energy: 1417 Joules   Total time: 1:33    Stab Phlebectomy: > 20 incisions  Sites: Thigh, Calf and Ankle  Patient tolerated procedure well  Description of Procedure:  After marking the course of the secondary varicosities, the patient was placed on the operating table in the supine position, and the left leg was prepped and draped in sterile fashion.   Local anesthetic was administered and under ultrasound guidance the saphenous vein was accessed with a micro needle and guide wire; then the mirco puncture sheath was placed.  A guide wire was inserted saphenofemoral junction , followed by a 5 french sheath.  The position of the sheath and then the laser fiber below the junction was confirmed using the ultrasound.  Tumescent anesthesia was administered along the course of the saphenous vein using ultrasound guidance. The patient was placed in Trendelenburg position and protective laser glasses were placed on patient and staff, and the laser was fired at 15 watts continuous mode advancing 1-66mm/second for a total of 1417 joules.   For stab phlebectomies, local anesthetic was administered at the previously marked varicosities, and tumescent anesthesia was administered around the vessels.  Greater than 20 stab wounds were made using the tip of an 11 blade. And using the vein hook, the phlebectomies  were performed using a hemostat to avulse the varicosities.  Adequate hemostasis was achieved.     Steri strips were applied to the stab wounds and ABD pads and thigh high compression stockings were applied.  Ace wrap bandages were applied over the phlebectomy sites and at the top of the saphenofemoral junction. Blood loss was less than 15 cc.  The patient ambulated out of the operating room having tolerated the procedure well.

## 2018-08-10 ENCOUNTER — Encounter: Payer: Self-pay | Admitting: Vascular Surgery

## 2018-08-16 ENCOUNTER — Ambulatory Visit (HOSPITAL_COMMUNITY)
Admission: RE | Admit: 2018-08-16 | Discharge: 2018-08-16 | Disposition: A | Discharge status: Home / Self Care | Payer: BLUE CROSS/BLUE SHIELD | Source: Ambulatory Visit | Attending: Vascular Surgery | Admitting: Vascular Surgery

## 2018-08-16 ENCOUNTER — Other Ambulatory Visit: Payer: Self-pay

## 2018-08-16 ENCOUNTER — Ambulatory Visit (INDEPENDENT_AMBULATORY_CARE_PROVIDER_SITE_OTHER): Payer: BLUE CROSS/BLUE SHIELD | Admitting: Vascular Surgery

## 2018-08-16 ENCOUNTER — Encounter: Payer: Self-pay | Admitting: Vascular Surgery

## 2018-08-16 VITALS — BP 122/82 | HR 83 | Temp 97.3°F | Resp 18 | Ht 73.0 in | Wt 228.4 lb

## 2018-08-16 DIAGNOSIS — I83812 Varicose veins of left lower extremities with pain: Secondary | ICD-10-CM

## 2018-08-16 DIAGNOSIS — Z48812 Encounter for surgical aftercare following surgery on the circulatory system: Secondary | ICD-10-CM

## 2018-08-16 NOTE — Progress Notes (Addendum)
   Patient name: Mathew Phillips MRN: [id number] DOB: 09-20-1960 Sex: male  REASON FOR VISIT:   Follow-up after endovenous laser ablation of the left great saphenous vein with greater than 20 stab phlebectomies.  HPI:   Mathew Phillips is a pleasant 58 y.o. male who had presented with painful varicose veins of the left lower extremity and failed conservative treatment.  He underwent endovenous laser ablation of the left great saphenous vein to the mid thigh.  In addition, he had greater than 20 stab phlebectomies.  He comes in for follow-up visit.  He has no specific complaints.  He denies leg swelling, chest pain, or shortness of breath.  He has gradually resume his normal activities.  He has been wearing his compression dressing.  He has 1 more week with his thigh-high stocking.  Of note he is planning a trip to [place] at the end of March.  Current Outpatient Medications  Medication Sig Dispense Refill  . hydrochlorothiazide (MICROZIDE) 12.5 MG capsule Take 1 capsule (12.5 mg total) by mouth as needed (swelling). (Patient not taking: Reported on 03/20/2018) 30 capsule 3  . metoprolol tartrate (LOPRESSOR) 50 MG tablet Take 1 tablet (50 mg total) by mouth as needed. (Patient not taking: Reported on 06/21/2018) 30 tablet 3   No current facility-administered medications for this visit.     REVIEW OF SYSTEMS:  [X]  denotes positive finding, [ ]  denotes negative finding Vascular    Leg swelling    Cardiac    Chest pain or chest pressure:    Shortness of breath upon exertion:    Short of breath when lying flat:    Irregular heart rhythm:    Constitutional    Fever or chills:     PHYSICAL EXAM:   Vitals:   08/16/18 1039  BP: 122/82  Pulse: 83  Resp: 18  Temp: (!) 97.3 F (36.3 C)  TempSrc: Oral  SpO2: 99%  Weight: 228 lb 6.3 oz (103.6 kg)  Height: 6\' 1"  (1.854 m)    GENERAL: The patient is a well-nourished male, in no acute distress. The vital signs are documented  above. CARDIOVASCULAR: There is a regular rate and rhythm. PULMONARY: There is good air exchange bilaterally without wheezing or rales. VASCULAR: He has no significant leg swelling.  DATA:   DUPLEX: I have independently interpreted his duplex scan.  He has had successful closure of the left great saphenous vein.  There is no evidence of DVT.  The thrombus at the saphenofemoral junction extend slightly into the femoral vein.  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   MEDICAL ISSUES:   STATUS POST LASER ABLATION LEFT GREAT SAPHENOUS VEIN AND STAB PHLEBECTOMIES: The patient is doing well status post laser ablation and stab phlebectomies.  Given that he has slight extension of the clot into the common femoral vein (EHIT 2), I have recommended that he begin taking 325 mg of aspirin daily, elevating his leg, and walking.  I have arranged a follow-up duplex scan in 2 weeks and I will see him back at that time.  Of note he is planning a trip to [place] so we will need to discuss the importance of precautions he can take during his long flight to prevent DVT when he returns in 2 weeks.  Deitra Mayo [facility] [phone]

## 2018-08-27 ENCOUNTER — Other Ambulatory Visit: Payer: Self-pay

## 2018-08-27 DIAGNOSIS — I83812 Varicose veins of left lower extremities with pain: Secondary | ICD-10-CM

## 2018-08-27 DIAGNOSIS — Z48812 Encounter for surgical aftercare following surgery on the circulatory system: Secondary | ICD-10-CM

## 2018-08-30 ENCOUNTER — Ambulatory Visit (INDEPENDENT_AMBULATORY_CARE_PROVIDER_SITE_OTHER): Payer: BLUE CROSS/BLUE SHIELD | Admitting: Vascular Surgery

## 2018-08-30 ENCOUNTER — Ambulatory Visit (HOSPITAL_COMMUNITY)
Admission: RE | Admit: 2018-08-30 | Discharge: 2018-08-30 | Disposition: A | Discharge status: Home / Self Care | Payer: BLUE CROSS/BLUE SHIELD | Source: Ambulatory Visit | Attending: Vascular Surgery | Admitting: Vascular Surgery

## 2018-08-30 ENCOUNTER — Other Ambulatory Visit: Payer: Self-pay

## 2018-08-30 ENCOUNTER — Encounter: Payer: Self-pay | Admitting: Vascular Surgery

## 2018-08-30 VITALS — BP 120/79 | HR 86 | Temp 98.4°F | Resp 18 | Ht 73.0 in | Wt 228.0 lb

## 2018-08-30 DIAGNOSIS — I83812 Varicose veins of left lower extremities with pain: Secondary | ICD-10-CM | POA: Diagnosis present

## 2018-08-30 DIAGNOSIS — Z48812 Encounter for surgical aftercare following surgery on the circulatory system: Secondary | ICD-10-CM | POA: Diagnosis not present

## 2018-08-30 NOTE — Progress Notes (Signed)
   Patient name: Mathew Phillips MRN: [id number] DOB: 1961/02/02 Sex: male  REASON FOR VISIT:   Follow-up after laser ablation of the left great saphenous vein with greater than 20 stab phlebectomies  HPI:   Mathew Phillips is a pleasant 58 y.o. male who would presented with painful varicose veins of the left lower extremity and had failed conservative treatment.  He underwent laser ablation of the left great saphenous vein on 08/09/2018.  On his follow-up duplex scan he had evidence of an EHIT 2.  Since I saw him last he denies any significant leg pain or swelling.  All of his ecchymosis has resolved.  Current Outpatient Medications  Medication Sig Dispense Refill  . hydrochlorothiazide (MICROZIDE) 12.5 MG capsule Take 1 capsule (12.5 mg total) by mouth as needed (swelling). (Patient not taking: Reported on 03/20/2018) 30 capsule 3  . metoprolol tartrate (LOPRESSOR) 50 MG tablet Take 1 tablet (50 mg total) by mouth as needed. (Patient not taking: Reported on 06/21/2018) 30 tablet 3   No current facility-administered medications for this visit.     REVIEW OF SYSTEMS:  [X]  denotes positive finding, [ ]  denotes negative finding Vascular    Leg swelling    Cardiac    Chest pain or chest pressure:    Shortness of breath upon exertion:    Short of breath when lying flat:    Irregular heart rhythm:    Constitutional    Fever or chills:     PHYSICAL EXAM:   There were no vitals filed for this visit.  GENERAL: The patient is a well-nourished male, in no acute distress. The vital signs are documented above. CARDIOVASCULAR: There is a regular rate and rhythm. PULMONARY: There is good air exchange bilaterally without wheezing or rales. VASCULAR: He has no significant ecchymosis in the left leg.  There is no significant leg swelling.  DATA:   VENOUS DUPLEX: I have independently interpreted his venous duplex scan today.  There is no evidence of DVT.  The EHIT 2 has completely resolved.   MEDICAL ISSUES:   STATUS POST LASER ABLATION LEFT GREAT SAPHENOUS VEIN: Patient is doing well status post laser ablation of his left great saphenous vein.  He has no evidence of DVT and is overall doing well.  I will see him back as needed.  Deitra Mayo [facility] [phone]

## 2019-05-27 ENCOUNTER — Other Ambulatory Visit: Payer: Self-pay

## 2019-05-27 DIAGNOSIS — Z20822 Contact with and (suspected) exposure to covid-19: Secondary | ICD-10-CM

## 2019-05-28 LAB — NOVEL CORONAVIRUS, NAA: SARS-CoV-2, NAA: DETECTED — AB

## 2019-05-29 ENCOUNTER — Telehealth: Payer: Self-pay | Admitting: Nurse Practitioner

## 2019-05-29 NOTE — Telephone Encounter (Signed)
Called to Discuss with patient about Covid symptoms and the use of bamlanivimab, a monoclonal antibody infusion for those with mild to moderate Covid symptoms and at a high risk of hospitalization.     Pt is qualified for this infusion at the [facility] infusion center due to co-morbid conditions and/or a member of an at-risk group.    Patient is being managed for hypertension.    Unable to reach pt

## 2019-06-05 ENCOUNTER — Ambulatory Visit: Payer: BC Managed Care – PPO | Admitting: Cardiovascular Disease

## 2019-07-02 ENCOUNTER — Ambulatory Visit (INDEPENDENT_AMBULATORY_CARE_PROVIDER_SITE_OTHER): Payer: BC Managed Care – PPO | Admitting: Cardiovascular Disease

## 2019-07-02 ENCOUNTER — Encounter: Payer: Self-pay | Admitting: Cardiovascular Disease

## 2019-07-02 ENCOUNTER — Other Ambulatory Visit: Payer: Self-pay

## 2019-07-02 VITALS — BP 104/78 | HR 89 | Temp 97.4°F | Ht 73.0 in | Wt 215.0 lb

## 2019-07-02 DIAGNOSIS — Z9889 Other specified postprocedural states: Secondary | ICD-10-CM

## 2019-07-02 DIAGNOSIS — Z8679 Personal history of other diseases of the circulatory system: Secondary | ICD-10-CM | POA: Diagnosis not present

## 2019-07-02 DIAGNOSIS — I4891 Unspecified atrial fibrillation: Secondary | ICD-10-CM

## 2019-07-02 DIAGNOSIS — G4733 Obstructive sleep apnea (adult) (pediatric): Secondary | ICD-10-CM

## 2019-07-02 DIAGNOSIS — I872 Venous insufficiency (chronic) (peripheral): Secondary | ICD-10-CM

## 2019-07-02 NOTE — Patient Instructions (Signed)
Medication Instructions:  Your physician recommends that you continue on your current medications as directed. Please refer to the Current Medication list given to you today.  If you need a refill on your cardiac medications before your next appointment, please call your pharmacy.   Lab work: NONE  Testing/Procedures: NONE  Follow-Up: At [facility], you and your health needs are our priority.  As part of our continuing mission to provide you with exceptional heart care, we have created designated Provider Care Teams.  These Care Teams include your primary Cardiologist (physician) and Advanced Practice Providers (APPs -  Physician Assistants and Nurse Practitioners) who all work together to provide you with the care you need, when you need it. You may see Shelva Majestic, MD or one of the following Advanced Practice Providers on your designated Care Team:    Almyra Deforest, PA-C  Fabian Sharp, [place] or   Roby Lofts, [place]  Your physician wants you to follow-up in: 1 year in the office with Dr Claiborne Billings

## 2019-07-02 NOTE — Progress Notes (Signed)
Cardiology Office Note    Date:  07/02/2019   ID:  Mathew Phillips, DOB Dec 06, 1960, MRN [id number]  PCP:  Brigitte Pulse, DO  Cardiologist:  Shelva Majestic, MD   Chief Complaint  Patient presents with  . Follow-up    15 months.    History of Present Illness:  Mathew Phillips is a 59 y.o. male who presents to the office today for an 57-monthfollow-up cardiology evaluation after establishing with his is now cardiology care with [place]  I last saw him in September 2019.  He presents for the month follow-up cardiology evaluation.  Mathew Phillips history of atrial tachycardia and in March 2014 [facility].  He had developed an episode of wide complex tachycardia which was picked up on outpatient monitoring in 2013 that was presumed to be VT.  He underwent cardiac catheterization, echo, and EPS as well as cardiac MRI.  Catheterization showed an EF of 60%; TEE revealed a normal EF.  EPS did not reveal inducible arrhythmia and he underwent successful cardioversion to sinus rhythm.  A cardiac MRI did not reveal infiltrative disease, and there was no evidence for RV dysplasia.  He was discharged with flecainide as well as metoprolol.   He underwent [facility] for atrial fibrillation.  Subsequently, he has stabilized and has not had any recurrent awareness of atrial fibrillation.  He tells me that he was diagnosed with obstructive sleep apnea one year ago was started on CPAP.  He self discontinued Toprol 6 months ago.  He denies any recent palpitations.  He denies any change in exercise tolerance.  He denies PND, orthopnea.   I recommended that he undergo a 2D echo Doppler study which was done on July 10, 2017.  This revealed normal LV systolic and diastolic function.  EF is 55 to 60%.  Atrial size was normal.  He had normal pulmonary pressures.  Over the past several months  he has continued to do well.  I have given him a prescription for metoprolol tartrate to take on a as needed basis.  He has not had any palpitations and has not had to use this.  He has obstructive sleep apnea, recently has been obtaining his supplies online.  He uses a fullface mask and admits to 100% compliance.  Recently, he has had issues of worsening lower leg edema.  He has varicose veins.  This past weekend apparently he noticed more leg swelling.  He was advised to go to the ER.  Lower extremity Doppler study was negative for DVT.  He presents for evaluation.   Past Medical History:  Diagnosis Date  . Atrial fibrillation ([place]   . Varicose veins of left lower extremity     Past Surgical History:  Procedure Laterality Date  . ATRIAL FIBRILLATION ABLATION    . ENDOVENOUS ABLATION SAPHENOUS VEIN W/ LASER Left 08/09/2018   endovenous laser ablation left greater saphenous vein and stab phlebectomy >20 incisions left leg by [place]ra Mayo[place]  . [place]  . SHOULDER SURGERY  both  . WRIST SURGERY Left     Current Medications: Outpatient Medications Prior to Visit  Medication Sig Dispense Refill  . hydrochlorothiazide (MICROZIDE) 12.5 MG capsule Take 1 capsule (12.5 mg total) by mouth as needed (swelling). (Patient not taking: Reported on 03/20/2018) 30 capsule 3  . metoprolol tartrate (LOPRESSOR) 50 MG tablet  Take 1 tablet (50 mg total) by mouth as needed. (Patient not taking: Reported on 06/21/2018) 30 tablet 3   No facility-administered medications prior to visit.     Allergies:   Yellow jacket venom [bee venom] and Codeine   Social History   Socioeconomic History  . Marital status: Married    Spouse name: Not on file  . Number of children: Not on file  . Years of education: Not on file  . Highest education level: Not on file  Occupational History  . Not on file  Tobacco Use  . Smoking status: Never Smoker  . Smokeless tobacco: Never Used  Substance and  Sexual Activity  . Alcohol use: Yes    Comment: rarely  . Drug use: No  . Sexual activity: Not on file  Other Topics Concern  . Not on file  Social History Narrative  . Not on file   Social Determinants of Health   Financial Resource Strain:   . Difficulty of Paying Living Expenses: Not on file  Food Insecurity:   . Worried About Charity fundraiser in the Last Year: Not on file  . Ran Out of Food in the Last Year: Not on file  Transportation Needs:   . Lack of Transportation (Medical): Not on file  . Lack of Transportation (Non-Medical): Not on file  Physical Activity:   . Days of Exercise per Week: Not on file  . Minutes of Exercise per Session: Not on file  Stress:   . Feeling of Stress : Not on file  Social Connections:   . Frequency of Communication with Friends and Family: Not on file  . Frequency of Social Gatherings with Friends and Family: Not on file  . Attends Religious Services: Not on file  . Active Member of Clubs or Organizations: Not on file  . Attends Archivist Meetings: Not on file  . Marital Status: Not on file    Social history is notable in that he is married for 28 years.  He has 2 children ages 9 and 25.  There is no history of tobacco use.  He drinks wine on occasion.  He exercises at least 3-5 days per week doing cardio and weights.  He works for [employer] as a Psychologist, forensic  Family History:  The patient's family history includes COPD in his mother; Cancer in his father; Coronary artery disease in his mother.  Family history is notable in that his mother died at age 82 and had hypertension, and COPD.  Father died at age 22 with lung cancer.  He was a heavy smoker.  He has a brother who is living at age 56 and has COPD.  He has 4 sisters.  2 sisters have cancer.  There is history of hypertension and 3 of his sisters.  ROS General: Negative; No fevers, chills, or night sweats;  HEENT: Negative; No changes in vision or  hearing, sinus congestion, difficulty swallowing Pulmonary: Negative; No cough, wheezing, shortness of breath, hemoptysis Cardiovascular: See history of present illness GI: Negative; No nausea, vomiting, diarrhea, or abdominal pain GU: Negative; No dysuria, hematuria, or difficulty voiding Musculoskeletal: Negative; no myalgias, joint pain, or weakness Hematologic/Oncology: Negative; no easy bruising, bleeding Endocrine: Negative; no heat/cold intolerance; no diabetes Neuro: Negative; no changes in balance, headaches Skin: Negative; No rashes or skin lesions Psychiatric: Negative; No behavioral problems, depression Sleep: Negative; No snoring, daytime sleepiness, hypersomnolence, bruxism, restless legs, hypnogognic hallucinations, no cataplexy Other comprehensive 14  point system review is negative.   PHYSICAL EXAM:   VS:  BP 104/78 (BP Location: Left Arm, Patient Position: Sitting, Cuff Size: Normal)   Pulse 89   Temp (!) 97.4 F (36.3 C)   Ht 6' 1"  (1.854 m)   Wt 215 lb (97.5 kg)   BMI 28.37 kg/m     Repeat blood pressure by me was 110/72  Wt Readings from Last 3 Encounters:  07/02/19 215 lb (97.5 kg)  08/30/18 228 lb (103.4 kg)  08/16/18 228 lb 6.3 oz (103.6 kg)    General: Alert, oriented, no distress.  Skin: normal turgor, no rashes, warm and dry HEENT: Normocephalic, atraumatic. Pupils equal round and reactive to light; sclera anicteric; extraocular muscles intact;  Nose without nasal septal hypertrophy Mouth/Parynx benign; Mallinpatti scale 3 Neck: No JVD, no carotid bruits; normal carotid upstroke Lungs: clear to ausculatation and percussion; no wheezing or rales Chest wall: Moderate pectus excavatum. Heart: PMI not displaced, RRR, s1 s2 normal, 1/6 systolic murmur, no diastolic murmur, no rubs, gallops, thrills, or heaves Abdomen: soft, nontender; no hepatosplenomehaly, BS+; abdominal aorta nontender and not dilated by palpation. Back: no CVA tenderness Pulses  2+ Musculoskeletal: full range of motion, normal strength, no joint deformities Extremities: Bilateral varicose veins, most prominent in the left lower extremity with prominent venous system of the proximal medial thigh and below the knee.  Trace edema; Homan's sign negative  Neurologic: grossly nonfocal; Cranial nerves grossly wnl Psychologic: Normal mood and affect   Studies/Labs Reviewed:   EKG:  EKG is ordered today.  ECG (independently read by me): Normal sinus rhythm at 89 bpm, PACs, incomplete right bundle branch block. Q-wave in lead III and aVF.  September 2019 ECG (independently read by me): Normal sinus rhythm at 70 bpm.  Mild RV conduction delay.  Small Q waves in lead III and aVF, nondiagnostic.  Normal intervals.  No ectopy.  January 2019 ECG (independently read by me): Normal sinus rhythm at 66 bpm.  Incomplete right bundle branch block.  Normal intervals.  No ectopy.  Recent Labs: BMP Latest Ref Rng & Units 07/04/2017  Glucose 65 - 99 mg/dL 94  BUN 6 - 24 mg/dL 13  Creatinine 0.76 - 1.27 mg/dL 0.90  BUN/Creat Ratio 9 - 20 14  Sodium 134 - 144 mmol/L 146(H)  Potassium 3.5 - 5.2 mmol/L 4.3  Chloride 96 - 106 mmol/L 109(H)  CO2 20 - 29 mmol/L 23  Calcium 8.7 - 10.2 mg/dL 9.0     Hepatic Function Latest Ref Rng & Units 07/04/2017  Total Protein 6.0 - 8.5 g/dL 6.8  Albumin 3.5 - 5.5 g/dL 4.3  AST 0 - 40 IU/L 15  ALT 0 - 44 IU/L 22  Alk Phosphatase 39 - 117 IU/L 83  Total Bilirubin 0.0 - 1.2 mg/dL 0.3    CBC Latest Ref Rng & Units 07/04/2017  WBC 3.4 - 10.8 x10E3/uL 6.5  Hemoglobin 13.0 - 17.7 g/dL 13.9  Hematocrit 37.5 - 51.0 % 40.1  Platelets 150 - 379 x10E3/uL 302   Lab Results  Component Value Date   MCV 87 07/04/2017   Lab Results  Component Value Date   TSH 0.898 07/04/2017   No results found for: HGBA1C   BNP No results found for: BNP  ProBNP No results found for: PROBNP   Lipid Panel     Component Value Date/Time   CHOL 146 07/04/2017  0826   TRIG 51 07/04/2017 0826   HDL 40 07/04/2017 0826  CHOLHDL 3.7 07/04/2017 0826   LDLCALC 96 07/04/2017 0826     RADIOLOGY: No results found.   Additional studies/ records that were reviewed today include:  I personally reviewed the patient's medical records from 2013 , his echo Doppler study as well as his office records from [facility] heart specialists   ASSESSMENT:    No diagnosis found.  PLAN:  Mathew Phillips is a 59 year old gentleman who underwent ablation for atrial fibrillation in 2013 in [place], [place].  At the time he was also felt to have episodes of wide-complex tachycardia with a presumptive diagnosis of ventricular tachycardia.  At EPS he was noninducible.  A cardiac MRI did not demonstrate any infiltrative disease, and there was no evidence for RV dysplasia.  Since undergoing his ablation.  He has been maintaining sinus rhythm.  He had stopped taking Toprol-XL as well as his primary antiarrhythmic therapy.  I reviewed his most recent echo Doppler study which showed both normal systolic and diastolic function without significant valvular pathology and with normal atrial dimensions bilaterally.  He is maintaining sinus rhythm.  He has a prescription for metoprolol to take on a as needed basis but has not required this.  Since I last saw him, reestablishing with a DME company locally for his supplies he has been getting his CPAP supplies online which he states is at reduced cost.  He has a [employer] CPAP unit and he believes this is slightly over 58 years old.  On exam today he has significant bilateral lower extremity varicosities left greater than right.  I suspect he may have significant venous insufficiency.  I have recommended he undergo a lower extremity venous insufficiency evaluation.  I have suggested support stockings full length to include the thigh for benefit.  I am giving her prescription for HCTZ 12.5 mg to take on a as needed basis for edema.   He remains active.  His work requires significant travel.  I strongly recommended that the support stockings always be worn on his long airplane trips.  I will contact him regarding his venous insufficiency study.  If significant, it may be worthwhile to refer to [facility] vein clinic.  I will see him in 1 year for reevaluation.  Medication Adjustments/Labs and Tests Ordered: Current medicines are reviewed at length with the patient today.  Concerns regarding medicines are outlined above.  Medication changes, Labs and Tests ordered today are listed in the Patient Instructions below. There are no Patient Instructions on file for this visit.   Signed, Shelva Majestic, MD  07/02/2019 4:20 PM    [facility] [address] [place], [place], [place]  [address] Phone: [phone]

## 2019-07-04 ENCOUNTER — Encounter: Payer: Self-pay | Admitting: Cardiovascular Disease

## 2019-07-04 NOTE — Progress Notes (Signed)
Cardiology Office Note    Date:  07/04/2019   ID:  Mathew Phillips, DOB 1961/03/28, MRN [id number]  PCP:  Brigitte Pulse, DO  Cardiologist:  Shelva Majestic, MD   Chief Complaint  Patient presents with  . Follow-up    15 months.    History of Present Illness:  Mathew Phillips is a 59 y.o. male who presents to the office today for an 46-monthfollow-up cardiology evaluation after establishing with his is now cardiology care with me [place]  I last saw him in September 2019.  He presents for a 16 month follow-up cardiology evaluation.  Mr. SWynnhas history of atrial tachycardia and in March 2014 [facility]  He had developed an episode of wide complex tachycardia which was picked up on outpatient monitoring in 2013 that was presumed to be VT.  He underwent cardiac catheterization, echo, and EPS as well as cardiac MRI.  Catheterization showed an EF of 60%; TEE revealed a normal EF.  EPS did not reveal inducible arrhythmia and he underwent successful cardioversion to sinus rhythm.  A cardiac MRI did not reveal infiltrative disease, and there was no evidence for RV dysplasia.  He was discharged with flecainide as well as metoprolol.   He underwent [facility] for atrial fibrillation.  Subsequently, he has stabilized and has not had any recurrent awareness of atrial fibrillation.  He tells me that he was diagnosed with obstructive sleep apnea one year ago was started on CPAP.  He self discontinued Toprol 6 months ago.  He denies any recent palpitations.  He denies any change in exercise tolerance.  He denies PND, orthopnea.   I recommended that he undergo a 2D echo Doppler study which was done on July 10, 2017.  This revealed normal LV systolic and diastolic function.  EF is 55 to 60%.  Atrial size was normal.  He had normal pulmonary pressures.  I last saw him in September  2019 at which time he was doing well.   I had given him a prescription for metoprolol tartrate to take on a as needed basis.  He has not had any palpitations and has not had to use this.  He has obstructive sleep apnea, recently has been obtaining his supplies online.  He uses a fullface mask and admits to 100% compliance.  That evaluation he was having difficulty with worsening lower extremity edema and had varicose veins.  Lower extremity Doppler was negative for DVT.  During that evaluation he had significant lower extremity varicosities left greater than right and I suspected significant venous insufficiency.  I recommended full-length support stockings and gave him prescription for HCTZ.  I referred him to the [facility].  He has undergone laser ablation of the left great saphenous vein by Dr. [name] last saw him in March 2020.  Presently, his lower extremity varicosities have resolved.  He tells me he tested positive for Covid on May 27, 2019.  He was quarantined at home.  He continues to use CPAP therapy and [facility]  He is now unemployed since he previously had worked [name] for day [employer].   Past Medical History:  Diagnosis Date  . Atrial fibrillation ([place]   . Varicose veins of left lower extremity     Past Surgical History:  Procedure Laterality Date  . ATRIAL FIBRILLATION ABLATION    . ENDOVENOUS ABLATION SAPHENOUS VEIN W/ LASER Left 08/09/2018   endovenous laser ablation left greater saphenous vein and stab phlebectomy >20 incisions left leg by Mathew Mayo MD  . [place]  both  . SHOULDER SURGERY  both  . WRIST SURGERY Left     Current Medications: Outpatient Medications Prior to Visit  Medication Sig Dispense Refill  . hydrochlorothiazide (MICROZIDE) 12.5 MG capsule Take 1 capsule (12.5 mg total) by mouth as needed (swelling). (Patient not taking: Reported on  03/20/2018) 30 capsule 3  . metoprolol tartrate (LOPRESSOR) 50 MG tablet Take 1 tablet (50 mg total) by mouth as needed. (Patient not taking: Reported on 06/21/2018) 30 tablet 3   No facility-administered medications prior to visit.     Allergies:   Yellow jacket venom [bee venom] and Codeine   Social History   Socioeconomic History  . Marital status: Married    Spouse name: Not on file  . Number of children: Not on file  . Years of education: Not on file  . Highest education level: Not on file  Occupational History  . Not on file  Tobacco Use  . Smoking status: Never Smoker  . Smokeless tobacco: Never Used  Substance and Sexual Activity  . Alcohol use: Yes    Comment: rarely  . Drug use: No  . Sexual activity: Not on file  Other Topics Concern  . Not on file  Social History Narrative  . Not on file   Social Determinants of Health   Financial Resource Strain:   . Difficulty of Paying Living Expenses: Not on file  Food Insecurity:   . Worried About Charity fundraiser in the Last Year: Not on file  . Ran Out of Food in the Last Year: Not on file  Transportation Needs:   . Lack of Transportation (Medical): Not on file  . Lack of Transportation (Non-Medical): Not on file  Physical Activity:   . Days of Exercise per Week: Not on file  . Minutes of Exercise per Session: Not on file  Stress:   . Feeling of Stress : Not on file  Social Connections:   . Frequency of Communication with Friends and Family: Not on file  . Frequency of Social Gatherings with Friends and Family: Not on file  . Attends Religious Services: Not on file  . Active Member of Clubs or Organizations: Not on file  . Attends Archivist Meetings: Not on file  . Marital Status: Not on file    Social history is notable in that he is married for 28 years.  He has 2 children ages 30 and 60.  There is no history of tobacco use.  He drinks wine on occasion.  He exercises at least 3-5 days per week  doing cardio and weights.  He works for [employer] as a Psychologist, forensic  Family History:  The patient'Phillips family history includes COPD in his mother; Cancer in his father; Coronary artery disease in his mother.  Family history is notable in that his mother died at age 35 and had hypertension, and COPD.  Father died at age 44 with lung cancer.  He was a heavy smoker.  He has a brother who is living at age 67 and has COPD.  He has 4 sisters.  2 sisters have cancer.  There is history of hypertension and 3 of his sisters.  ROS General: Negative; No  fevers, chills, or night sweats;  HEENT: Negative; No changes in vision or hearing, sinus congestion, difficulty swallowing Pulmonary: Negative; No cough, wheezing, shortness of breath, hemoptysis Cardiovascular: See history of present illness GI: Negative; No nausea, vomiting, diarrhea, or abdominal pain GU: Negative; No dysuria, hematuria, or difficulty voiding Musculoskeletal: Negative; no myalgias, joint pain, or weakness Hematologic/Oncology: Negative; no easy bruising, bleeding Endocrine: Negative; no heat/cold intolerance; no diabetes Neuro: Negative; no changes in balance, headaches Skin: Negative; No rashes or skin lesions Psychiatric: Negative; No behavioral problems, depression Sleep: Negative; No snoring, daytime sleepiness, hypersomnolence, bruxism, restless legs, hypnogognic hallucinations, no cataplexy Other comprehensive 14 point system review is negative.   PHYSICAL EXAM:   VS:  BP 104/78 (BP Location: Left Arm, Patient Position: Sitting, Cuff Size: Normal)   Pulse 89   Temp (!) 97.4 F (36.3 C)   Ht 6' 1"  (1.854 m)   Wt 215 lb (97.5 kg)   BMI 28.37 kg/m     Repeat blood pressure by me was 110/72  Wt Readings from Last 3 Encounters:  07/02/19 215 lb (97.5 kg)  08/30/18 228 lb (103.4 kg)  08/16/18 228 lb 6.3 oz (103.6 kg)    General: Alert, oriented, no distress.  Skin: normal turgor, no rashes, warm  and dry HEENT: Normocephalic, atraumatic. Pupils equal round and reactive to light; sclera anicteric; extraocular muscles intact; Fundi ** Nose without nasal septal hypertrophy Mouth/Parynx benign; Mallinpatti scale 3 Neck: No JVD, no carotid bruits; normal carotid upstroke Lungs: clear to ausculatation and percussion; no wheezing or rales Chest wall: Moderate pectus excavatum;  tenderness to palpitation Heart: PMI not displaced, RRR, s1 s2 normal, 1/6 systolic murmur, no diastolic murmur, no rubs, gallops, thrills, or heaves Abdomen: soft, nontender; no hepatosplenomehaly, BS+; abdominal aorta nontender and not dilated by palpation. Back: no CVA tenderness Pulses 2+ Musculoskeletal: full range of motion, normal strength, no joint deformities Extremities: Resolution of significant bilateral varicose veins no clubbing cyanosis or edema, Homan'Phillips sign negative  Neurologic: grossly nonfocal; Cranial nerves grossly wnl Psychologic: Normal mood and affect    Studies/Labs Reviewed:   EKG:  EKG is ordered today.  ECG (independently read by me): Normal sinus rhythm at 89 bpm, PACs, incomplete right bundle branch block. Q-wave in lead III and aVF.  September 2019 ECG (independently read by me): Normal sinus rhythm at 70 bpm.  Mild RV conduction delay.  Small Q waves in lead III and aVF, nondiagnostic.  Normal intervals.  No ectopy.  January 2019 ECG (independently read by me): Normal sinus rhythm at 66 bpm.  Incomplete right bundle branch block.  Normal intervals.  No ectopy.  Recent Labs: BMP Latest Ref Rng & Units 07/04/2017  Glucose 65 - 99 mg/dL 94  BUN 6 - 24 mg/dL 13  Creatinine 0.76 - 1.27 mg/dL 0.90  BUN/Creat Ratio 9 - 20 14  Sodium 134 - 144 mmol/L 146(H)  Potassium 3.5 - 5.2 mmol/L 4.3  Chloride 96 - 106 mmol/L 109(H)  CO2 20 - 29 mmol/L 23  Calcium 8.7 - 10.2 mg/dL 9.0     Hepatic Function Latest Ref Rng & Units 07/04/2017  Total Protein 6.0 - 8.5 g/dL 6.8  Albumin 3.5 - 5.5  g/dL 4.3  AST 0 - 40 IU/L 15  ALT 0 - 44 IU/L 22  Alk Phosphatase 39 - 117 IU/L 83  Total Bilirubin 0.0 - 1.2 mg/dL 0.3    CBC Latest Ref Rng & Units 07/04/2017  WBC 3.4 - 10.8 x10E3/uL 6.5  Hemoglobin 13.0 - 17.7 g/dL 13.9  Hematocrit 37.5 - 51.0 % 40.1  Platelets 150 - 379 x10E3/uL 302   Lab Results  Component Value Date   MCV 87 07/04/2017   Lab Results  Component Value Date   TSH 0.898 07/04/2017   No results found for: HGBA1C   BNP No results found for: BNP  ProBNP No results found for: PROBNP   Lipid Panel     Component Value Date/Time   CHOL 146 07/04/2017 0826   TRIG 51 07/04/2017 0826   HDL 40 07/04/2017 0826   CHOLHDL 3.7 07/04/2017 0826   LDLCALC 96 07/04/2017 0826     RADIOLOGY: No results found.   Additional studies/ records that were reviewed today include:  I personally reviewed the patient'Phillips medical records from 2013 , his echo Doppler study as well as his [facility] records from [facility]   ASSESSMENT:    1. Atrial fibrillation, unspecified type ([place])   2. Phillips/P ablation of atrial fibrillation   3. Venous insufficiency   4. H/O varicose veins of lower extremity: Phillips/p laser ablation   5. OSA (obstructive sleep apnea)on CPAP     PLAN:  Mr. Laurance Heide is a 59 year old gentleman who underwent ablation for atrial fibrillation in 2013 in [place], [place].  At the time he was also felt to have episodes of wide-complex tachycardia with a presumptive diagnosis of ventricular tachycardia.  At EPS he was noninducible.  A cardiac MRI did not demonstrate any infiltrative disease, and there was no evidence for RV dysplasia.  He has been maintaining sinus rhythm and has been without tachycardia.  Initially he was on Toprol-XL but is no longer on any medication.  When I last saw him, he had significant bilateral varicose veins left greater than right.  He has undergone successful laser ablation therapy of the left great saphenous  vein with greater than 20 stab phlebotomies and is followed by Dr. Shanon Phillips.  He continues to use CPAP therapy for his obstructive sleep apnea.  He is sleeping well.  He continues to use his DME company from [facility].  He recently suffered COVID-19 infection associated with cough low-grade fever and stayed home during his infection.  Symptoms have resolved.  He was treated with Z-Pak and prednisone.  His wife and daughter also cannot contracted the disease.  His blood pressure today is stable.  BMI is 28.37.  He has a prescription for Toprol all as needed but has not required use.  Stable I will see him in 1 year for follow-up evaluation or sooner as needed.   Medication Adjustments/Labs and Tests Ordered: Current medicines are reviewed at length with the patient today.  Concerns regarding medicines are outlined above.  Medication changes, Labs and Tests ordered today are listed in the Patient Instructions below. Patient Instructions  Medication Instructions:  Your physician recommends that you continue on your current medications as directed. Please refer to the Current Medication list given to you today.  If you need a refill on your cardiac medications before your next appointment, please call your pharmacy.   Lab work: NONE  Testing/Procedures: NONE  Follow-Up: At [facility], you and your health needs are our priority.  As part of our continuing mission to provide you with exceptional heart care, we have created designated Provider Care Teams.  These Care Teams include your primary Cardiologist (physician) and Advanced Practice Providers (APPs -  Physician Assistants and Nurse Practitioners) who all work together to  provide you with the care you need, when you need it. You may see Shelva Majestic, MD or one of the following Advanced Practice Providers on your designated Care Team:    Almyra Deforest, PA-C  Fabian Sharp, [place] or   Roby Lofts, [place]  Your physician wants  you to follow-up in: 1 year in the office with Dr Claiborne Billings         Signed, Shelva Majestic, MD  07/04/2019 6:40 PM    [facility] [address], [place], [place], [place]  [address] Phone: [phone]

## 2020-04-28 DIAGNOSIS — R5383 Other fatigue: Secondary | ICD-10-CM | POA: Diagnosis not present

## 2020-04-28 DIAGNOSIS — E559 Vitamin D deficiency, unspecified: Secondary | ICD-10-CM | POA: Diagnosis not present

## 2020-04-28 DIAGNOSIS — Z0000 Encounter for general adult medical examination without abnormal findings: Secondary | ICD-10-CM | POA: Diagnosis not present

## 2020-04-29 DIAGNOSIS — Q676 Pectus excavatum: Secondary | ICD-10-CM | POA: Diagnosis not present

## 2020-04-29 DIAGNOSIS — I4891 Unspecified atrial fibrillation: Secondary | ICD-10-CM | POA: Diagnosis not present

## 2020-04-29 DIAGNOSIS — U099 Post covid-19 condition, unspecified: Secondary | ICD-10-CM | POA: Diagnosis not present

## 2020-04-29 DIAGNOSIS — R0602 Shortness of breath: Secondary | ICD-10-CM | POA: Diagnosis not present

## 2020-04-29 DIAGNOSIS — U071 COVID-19: Secondary | ICD-10-CM | POA: Diagnosis not present

## 2020-05-25 ENCOUNTER — Telehealth: Payer: Self-pay | Admitting: Cardiovascular Disease

## 2020-05-25 NOTE — Telephone Encounter (Signed)
Patient stated that he and his wife have started to walk on regular basis. They walk about 1/2 mile to 3 miles a few times a week. During his walks HR has been 125-130 and feeling fine. Starting this weekend he was walking and started having fluttering/palpitations and shortness of breath but no dizziness. This is the first time he has felt like this since before his Afib. Ablation some years ago. During the times he feels the palpitations he stops activity and rest for a bit and the feeling goes away. During the time of activity his smart watch give HR of 180. Feeling usually last less that 30 minutes and seems to only happen with activity or movement. He feels fine today and been moving around the house and HR in 60's.  Patient last Office note does have Lopressor 50 mg ordered PRN for breakthrough but patient has not used this and I will send in updated Rx to pharmacy. In addition, checking with provider if need appointment to evaluate change.

## 2020-05-25 NOTE — Telephone Encounter (Signed)
Patient c/o Palpitations:  High priority if patient c/o lightheadedness, shortness of breath, or chest pain  1) How long have you had palpitations/irregular HR/ Afib? Are you having the symptoms now? Heart flutters that happened Saturday and Sunday  2) Are you currently experiencing lightheadedness, SOB or CP? Chest pressure from time to time.  3) Do you have a history of afib (atrial fibrillation) or irregular heart rhythm? yes  4) Have you checked your BP or HR? (document readings if available): BP 120/68 and HR 180 on 05/23/20  5) Are you experiencing any other symptoms? No

## 2020-05-25 NOTE — Telephone Encounter (Signed)
Patient made aware that he can see Dr. Claiborne Billings tomorrow morning during virtual clinic at 9:40am. Patient was able to log into his MyChart and could see information for his appointment. Made aware that will hold on his Lopressor rx until after his appointment with Dr. Claiborne Billings tomorrow. Patient agrees with plan and no additional questions at this time.

## 2020-05-26 ENCOUNTER — Telehealth (INDEPENDENT_AMBULATORY_CARE_PROVIDER_SITE_OTHER): Payer: BC Managed Care – PPO | Admitting: Cardiovascular Disease

## 2020-05-26 ENCOUNTER — Telehealth: Payer: Self-pay

## 2020-05-26 ENCOUNTER — Encounter: Payer: Self-pay | Admitting: Cardiovascular Disease

## 2020-05-26 DIAGNOSIS — G4733 Obstructive sleep apnea (adult) (pediatric): Secondary | ICD-10-CM

## 2020-05-26 DIAGNOSIS — Z8679 Personal history of other diseases of the circulatory system: Secondary | ICD-10-CM

## 2020-05-26 DIAGNOSIS — I4891 Unspecified atrial fibrillation: Secondary | ICD-10-CM

## 2020-05-26 DIAGNOSIS — Z9889 Other specified postprocedural states: Secondary | ICD-10-CM

## 2020-05-26 DIAGNOSIS — R002 Palpitations: Secondary | ICD-10-CM | POA: Diagnosis not present

## 2020-05-26 MED ORDER — METOPROLOL SUCCINATE ER 25 MG PO TB24: 25.0000 mg | ORAL_TABLET | Freq: Every day | ORAL | 3 refills | Status: DC

## 2020-05-26 NOTE — Progress Notes (Signed)
Virtual Visit via Telephone Note   This visit type was conducted due to national recommendations for restrictions regarding the COVID-19 Pandemic (e.g. social distancing) in an effort to limit this patient's exposure and mitigate transmission in our community.  Due to his co-morbid illnesses, this patient is at least at moderate risk for complications without adequate follow up.  This format is felt to be most appropriate for this patient at this time.  The patient did not have access to video technology/had technical difficulties with video requiring transitioning to audio format only (telephone).  All issues noted in this document were discussed and addressed.  No physical exam could be performed with this format.  Please refer to the patient's chart for his  consent to telehealth for [facility].    Date:  05/28/2020   ID:  Mathew Phillips, DOB [id number], MRN [id number] The patient was identified using 2 identifiers.  Patient Location: Home Provider Location: Home Office  PCP:  Brigitte Pulse, DO  Cardiologist:  Shelva Majestic, MD  Electrophysiologist:  None   Evaluation Performed:  Follow-Up Visit  Chief Complaint: On to schedule with chief complaint of recurrent palpitations and chest fluttering  History of Present Illness:    Mathew Phillips is a 59 y.o. male who was added onto my telemedicine schedule today after calling the office with complaints of recurrent palpitations.   Mr. Littler has history of atrial tachycardia and in March 2014 was evaluated by Dr Casimer Bilis at [facility] specialists.  He had developed an episode of wide complex tachycardia which was picked up on outpatient monitoring in 2013 that was presumed to be VT.  He underwent cardiac catheterization, echo, and EPS as well as cardiac MRI.  Catheterization showed an EF of 60%; TEE revealed a normal EF.  EPS did not reveal inducible arrhythmia and he underwent successful cardioversion to sinus rhythm.  A  cardiac MRI did not reveal infiltrative disease, and there was no evidence for RV dysplasia.  He was discharged with flecainide as well as metoprolol.   He underwent [facility] for atrial fibrillation.  Subsequently, he has stabilized and has not had any recurrent awareness of atrial fibrillation.  He tells me that he was diagnosed with obstructive sleep apnea one year ago was started on CPAP.  He self discontinued Toprol 6 months ago.  He denies any recent palpitations.  He denies any change in exercise tolerance.  He denies PND, orthopnea.   He established cardiology care with me after moving back to [place] from [place].  I recommended that he undergo a 2D echo Doppler study which was done on July 10, 2017.  This revealed normal LV systolic and diastolic function.  EF is 55 to 60%.  Atrial size was normal.  He had normal pulmonary pressures.  I had last evaluated him in January 2021.  Over the several months previously he was continuing to do well.  I had given him a prescription for metoprolol tartrate at a prior visit but he had not required any use. He has obstructive sleep apnea, recently has been obtaining his supplies online.  He uses a fullface mask and admits to 100% compliance.  Recently, he has had issues of worsening lower leg edema.  He has varicose veins.  This past weekend apparently he noticed more leg swelling.  He was advised to go to the ER.  Lower extremity Doppler study was negative for DVT.  He has undergone successful laser ablation therapy of the left great  saphenous vein and is followed by Dr. Shanon Rosser.  Presently, he admits to continued CPAP use.  In December 2020 he did develop Covid and had 3 months of extreme fatigue following his infection.  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  Recently he and his wife started to walk on a regular basis and walk anywhere from 1/2 mile to 3 miles several times a week.  During  his walks his heart rate has been in the 1 25-1 30 range.  However this past weekend he began to notice fluttering and palpitations with mild shortness of breath without dizziness.  When he feels the palpitations he stops activity and rests and the symptoms resolve.  During time of activity his smart watch had documented a heart rate of 180 bpm.  He is not use the Lopressor dose.  He denies any chest pain.  He called the office and is worked into my schedule today.    The patient does not have symptoms concerning for COVID-19 infection (fever, chills, cough, or new shortness of breath).    Past Medical History:  Diagnosis Date  . Atrial fibrillation ([place])   . Varicose veins of left lower extremity    Past Surgical History:  Procedure Laterality Date  . ATRIAL FIBRILLATION ABLATION    . ENDOVENOUS ABLATION SAPHENOUS VEIN W/ LASER Left 08/09/2018   endovenous laser ablation left greater saphenous vein and stab phlebectomy >20 incisions left leg by Deitra Mayo MD  . [facility]  both  . SHOULDER SURGERY  both  . WRIST SURGERY Left      No outpatient medications have been marked as taking for the 05/26/20 encounter (Video Visit) with Troy Sine, MD.     Allergies:   Yellow jacket venom [bee venom] and Codeine   Social History   Tobacco Use  . Smoking status: Never Smoker  . Smokeless tobacco: Never Used  Vaping Use  . Vaping Use: Never used  Substance Use Topics  . Alcohol use: Yes    Comment: rarely  . Drug use: No     Family Hx: The patient's family history includes COPD in his mother; Cancer in his father; Coronary artery disease in his mother.  ROS:   Please see the history of present illness.    Remote Covid infection December 2020 with protracted fatigability for 3 months No chest pain Recent palpitation Varicose veins On CPAP with 100% compliance All other systems reviewed and are negative.   Prior CV studies:   The following studies were reviewed  today:  ECHO 07/10/2017 Study Conclusions  - Left ventricle: The cavity size was normal. Wall thickness was  normal. Systolic function was normal. The estimated ejection  fraction was in the range of 55% to 60%. Left ventricular  diastolic function parameters were normal.  - Right ventricle: The cavity size was mildly dilated. Wall  thickness was normal.   Labs/Other Tests and Data Reviewed:    EKG: No EKG was able to be obtained today since this was a virtual visit.  However I personally reviewed his last ECG from July 02, 2019 which showed normal sinus rhythm at 89 bpm, PACs, incomplete right bundle branch block, and small Q-wave in lead III and aVF.  Recent Labs: No results found for requested labs within last 8760 hours.   Recent Lipid Panel Lab Results  Component Value Date/Time   CHOL 146 07/04/2017 08:26 AM   TRIG 51  07/04/2017 08:26 AM   HDL 40 07/04/2017 08:26 AM   CHOLHDL 3.7 07/04/2017 08:26 AM   LDLCALC 96 07/04/2017 08:26 AM    Wt Readings from Last 3 Encounters:  05/26/20 220 lb (99.8 kg)  07/02/19 215 lb (97.5 kg)  08/30/18 228 lb (103.4 kg)    Personally reviewed laboratory from his primary physician:  Vitamin D 27 Hemoglobin 14.8 4.8 Hemoglobin A1c 5.5 Creatinine 0.9 LDL cholesterol 118, HDL cholesterol 48, triglycerides 50, and total cholesterol 176  Objective:    Vital Signs:  BP 136/86   Pulse 79   Wt 220 lb (99.8 kg)   BMI 29.03 kg/m    This was a virtual visit I could not physically examine the patient. Breathing was normal and not labored There was no audible wheeze There was regular with pulse in the 70s He denied any chest wall discomfort or abdominal pain. There was no swelling He had normal affect and mood   ASSESSMENT & PLAN:    1. Palpitations: Mr. Imburgia has a history of atrial tachycardia and remotely had developed an episode of wide-complex tachycardia.  He had undergone prior cardiac catheterization, echo and EPS  as well as cardiac MRI.  He did not have any inducible arrhythmia.  A cardiac MRI did not reveal infiltrative disease and there was no evidence for RV to place here.  He underwent [facility] ACA for atrial fibrillation and remotely had been on flecainide in addition to metoprolol which had been started while he lived in [place].  He remotely had self discontinued his Toprol and has been off flecainide.  Presently, I have recommended starting metoprolol succinate 25 mg once a day.  If he continues to have episodic palpitations, we will then have him wear a 2 to 4-week monitor for further evaluation I am scheduling him for a follow-up echo Doppler study to reassess LV systolic and diastolic function as well as chamber dimensions.  I reviewed his recent laboratory.  I have recommended he institute a baby aspirin.  I have recommended he stay hydrated. 2. History of DVT March 2020 3. OSA on CPAP with 100% compliance 4. 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 The importance of social distancing was discussed today.  Time:   Today, I have spent 22 minutes with the patient with telehealth technology discussing the above problems.     Medication Adjustments/Labs and Tests Ordered: Current medicines are reviewed at length with the patient today.  Concerns regarding medicines are outlined above.   Tests Ordered: Orders Placed This Encounter  Procedures  . ECHOCARDIOGRAM COMPLETE    Medication Changes: Meds ordered this encounter  Medications  . metoprolol succinate (TOPROL XL) 25 MG 24 hr tablet    Sig: Take 1 tablet (25 mg total) by mouth daily.    Dispense:  90 tablet    Refill:  3    Follow Up: In office follow-up in 6 to 8 weeks  Signed, Shelva Majestic, MD  05/28/2020 5:52 PM    [place]

## 2020-05-26 NOTE — Telephone Encounter (Signed)
Left message for pt to call back to discuss AVS and new orders.

## 2020-05-26 NOTE — Telephone Encounter (Signed)
Conveyed Dr. Evette Georges orders to pt. The patient verbalizes understanding and agreement with plan.

## 2020-05-26 NOTE — Telephone Encounter (Signed)
Pt called in returning [place] call,

## 2020-05-26 NOTE — Telephone Encounter (Signed)
[name], hippe are scheduled for a virtual visit with your provider today.    Just as we do with appointments in the office, we must obtain your consent to participate.  Your consent will be active for this visit and any virtual visit you may have with one of our providers in the next 365 days.    If you have a MyChart account, I can also send a copy of this consent to you electronically.  All virtual visits are billed to your insurance company just like a traditional visit in the office.  As this is a virtual visit, video technology does not allow for your provider to perform a traditional examination.  This may limit your provider's ability to fully assess your condition.  If your provider identifies any concerns that need to be evaluated in person or the need to arrange testing such as labs, EKG, etc, we will make arrangements to do so.    Although advances in technology are sophisticated, we cannot ensure that it will always work on either your end or our end.  If the connection with a video visit is poor, we may have to switch to a telephone visit.  With either a video or telephone visit, we are not always able to ensure that we have a secure connection.   I need to obtain your verbal consent now.   Are you willing to proceed with your visit today?   Mathew Phillips has provided verbal consent on 05/26/2020 for a virtual visit (video or telephone).   [name], RN 05/26/2020  10:27 AM

## 2020-05-26 NOTE — Patient Instructions (Addendum)
Medication Instructions:  START taking metoprolol succinate 25 mg by mouth once a day *If you need a refill on your cardiac medications before your next appointment, please call your pharmacy*   Lab Work: None ordered If you have labs (blood work) drawn today and your tests are completely normal, you will receive your results only by: Marland Kitchen MyChart Message (if you have MyChart) OR . A paper copy in the mail If you have any lab test that is abnormal or we need to change your treatment, we will call you to review the results.   Testing/Procedures: Your physician has requested that you have an echocardiogram prior to your next follow up appointment. Echocardiography is a painless test that uses sound waves to create images of your heart. It provides your doctor with information about the size and shape of your heart and how well your heart's chambers and valves are working. This procedure takes approximately one hour. There are no restrictions for this procedure. This test is performed at [address].     Follow-Up: At [facility], you and your health needs are our priority.  As part of our continuing mission to provide you with exceptional heart care, we have created designated Provider Care Teams.  These Care Teams include your primary Cardiologist (physician) and Advanced Practice Providers (APPs -  Physician Assistants and Nurse Practitioners) who all work together to provide you with the care you need, when you need it.  We recommend signing up for the patient portal called "MyChart".  Sign up information is provided on this After Visit Summary.  MyChart is used to connect with patients for Virtual Visits (Telemedicine).  Patients are able to view lab/test results, encounter notes, upcoming appointments, etc.  Non-urgent messages can be sent to your provider as well.   To learn more about what you can do with MyChart, go to [email or web address].    Your next appointment:   6-8  week(s)  The format for your next appointment:   In Person  Provider:   Shelva Majestic, MD   Other Instructions None

## 2020-05-28 ENCOUNTER — Encounter: Payer: Self-pay | Admitting: Cardiovascular Disease

## 2020-06-22 ENCOUNTER — Ambulatory Visit (HOSPITAL_COMMUNITY): Payer: BC Managed Care – PPO | Attending: Cardiology

## 2020-06-22 ENCOUNTER — Other Ambulatory Visit: Payer: Self-pay

## 2020-06-22 DIAGNOSIS — I4891 Unspecified atrial fibrillation: Secondary | ICD-10-CM | POA: Insufficient documentation

## 2020-06-22 LAB — ECHOCARDIOGRAM COMPLETE
Area-P 1/2: 2.92 cm2
P 1/2 time: 326 msec
S' Lateral: 2.7 cm

## 2020-07-29 ENCOUNTER — Encounter: Payer: Self-pay | Admitting: Cardiovascular Disease

## 2020-07-29 ENCOUNTER — Telehealth (INDEPENDENT_AMBULATORY_CARE_PROVIDER_SITE_OTHER): Payer: BC Managed Care – PPO | Admitting: Cardiovascular Disease

## 2020-07-29 VITALS — BP 108/60 | HR 64 | Ht 73.0 in | Wt 219.0 lb

## 2020-07-29 DIAGNOSIS — Z9889 Other specified postprocedural states: Secondary | ICD-10-CM | POA: Diagnosis not present

## 2020-07-29 DIAGNOSIS — G4733 Obstructive sleep apnea (adult) (pediatric): Secondary | ICD-10-CM

## 2020-07-29 DIAGNOSIS — E559 Vitamin D deficiency, unspecified: Secondary | ICD-10-CM

## 2020-07-29 DIAGNOSIS — R002 Palpitations: Secondary | ICD-10-CM | POA: Diagnosis not present

## 2020-07-29 DIAGNOSIS — Z8679 Personal history of other diseases of the circulatory system: Secondary | ICD-10-CM | POA: Diagnosis not present

## 2020-07-29 NOTE — Progress Notes (Signed)
Virtual Visit via Telephone Note   This visit type was conducted due to national recommendations for restrictions regarding the COVID-19 Pandemic (e.g. social distancing) in an effort to limit this patient's exposure and mitigate transmission in our community.  Due to his co-morbid illnesses, this patient is at least at moderate risk for complications without adequate follow up.  This format is felt to be most appropriate for this patient at this time.  The patient did not have access to video technology/had technical difficulties with video requiring transitioning to audio format only (telephone).  All issues noted in this document were discussed and addressed.  No physical exam could be performed with this format.  Please refer to the patient's chart for his  consent to telehealth for [facility].    Date:  07/29/2020   ID:  Mathew Phillips, DOB 08-Oct-1960, MRN [id number] The patient was identified using 2 identifiers.  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 male who has a history of atrial tachycardia and in March 2014 was evaluated by Dr Casimer Bilis at [facility] heart specialists. He had developed an episode of wide complex tachycardia which was picked up on outpatient monitoring in 2013 that was presumed to be VT. He underwent cardiac catheterization, echo, and EPS as well as cardiac MRI. Catheterization showed an EF of 60%; TEE revealed a normal EF. EPS did not reveal inducible arrhythmia and he underwent successful cardioversion to sinus rhythm. A cardiac MRI did not reveal infiltrative disease, and there was no evidence for RV dysplasia. He was discharged with flecainide as well as metoprolol. He underwent [facility] for atrial fibrillation.  Subsequently, he has stabilized and has not had any recurrent awareness of atrial fibrillation. He tells me that he was diagnosed with obstructive sleep apnea one year ago was started on CPAP. He self discontinued Toprol 6 months ago. He denies any recent palpitations. He denies any change in exercise tolerance. He denies PND, orthopnea.   He established cardiology care with me after moving back to [place] from [place].  I recommended that he undergo a 2D echo Doppler study which was done on July 10, 2017. This revealed normal LV systolic and diastolic function. EF is 55 to 60%. Atrial size was normal. He had normal pulmonary pressures.  I had last evaluated him in January 2021.  Over the several months previously he was continuing to do well.  I had given him a prescription for metoprolol tartrate at a prior visit but he had not required any use. He has obstructive sleep apnea, recently has been obtaining his supplies online. He uses a fullface mask and admits to 100% compliance. Recently, he has had issues of worsening lower leg edema. He has varicose veins. This past weekend apparently he noticed more leg swelling. He was advised to go to the ER. Lower extremity Doppler study was negative for DVT.  He has undergone successful laser ablation therapy of the left great saphenous vein and is followed by Dr. Shanon Rosser.  He admits to continued CPAP use.  In December 2020 he developed Covid and had 3 months of extreme fatigue following his infection.  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  Recently he and his wife started to walk on a regular basis and walk anywhere from 1/2 mile to 3 miles several times a week.  During his walks his heart rate has been in the 125-130 range. I last evaluated him in a telemedicine visit in December 2021.  At that time, he began to notice fluttering and palpitations with mild  shortness of breath without dizziness.  When he feels the palpitations he stops activity and rests and the symptoms resolve.  During time of activity his smart watch had documented a heart rate of 180 bpm.  At that time he had not used his prior metoprolol.  He denied any chest pain.  He had called the office and was added onto my schedule for telemedicine visit.  During that evaluation, I recommended he reinstitute metoprolol succinate at 25 mg daily and also schedule him for follow-up echo Doppler therapy.    Mathew Phillips underwent his echo evaluation on June 22, 2020 which showed normal LV systolic function with EF at 60 to 65%.  There was evidence for moderate RA and RV dilation with mild left atrial enlargement.  He had normal diastolic parameters.  Since reinitiating metoprolol succinate, he has noticed complete resolution of his prior symptoms.  He continues to be active.  He typically has several walks a day and oftentimes will walk for a little break at work for half mile and then later in the day walk for over 2 miles.  On average he walks at least 8000 - 10,000 steps during the week, and on weekends 14,000-15,000 steps.  He denies any anginal symptoms.  He denies any exertional shortness of breath.  He admits to continued 100% compliance with CPAP therapy.  He had recently seen his primary care physician Dr. Zigmund Daniel at [facility].  Laboratory was drawn.  He recalls that his total cholesterol was in the range of 175.  He did not know the specifics of his laboratory but that his vitamin D level was mildly low and he was started on supplemental therapy.  He presents for follow-up telemedicine evaluation which was scheduled to be a video visit but there was difficulty with his reception and as result was changed to a phone visit.   The patient does not have symptoms concerning for COVID-19 infection (fever, chills, cough, or new shortness of breath).    Past Medical History:  Diagnosis Date    Atrial fibrillation ([place])    Varicose veins of left lower extremity    Past Surgical History:  Procedure Laterality Date   ATRIAL FIBRILLATION ABLATION     ENDOVENOUS ABLATION SAPHENOUS VEIN W/ LASER Left 08/09/2018   endovenous laser ablation left greater saphenous vein and stab phlebectomy >20 incisions left leg by Deitra Mayo MD   KNEE SURGERY  both   SHOULDER SURGERY  both   WRIST SURGERY Left      Current Meds  Medication Sig   metoprolol succinate (TOPROL XL) 25 MG 24 hr tablet Take 1 tablet (25 mg total) by mouth daily.     Allergies:   Yellow jacket venom [bee venom] and Codeine   Social History   Tobacco Use   Smoking status: Never Smoker   Smokeless tobacco: Never Used  Vaping Use   Vaping Use: Never used  Substance Use Topics   Alcohol use: Yes    Comment: rarely   Drug use: No     Family Hx: The patient's family history includes  COPD in his mother; Cancer in his father; Coronary artery disease in his mother.  ROS:   Please see the history of present illness.    Remote Covid infection December 2020 with prolonged fatigability for approximately 3 months No current fever chills or night sweats or cough Palpitations have resolved with initiation of metoprolol succinate Status post laser procedure for his left greater saphenous vein from upper thigh to lower calf.  No residual swelling Vitamin D insufficiency, recently started on supplementation 100% CPAP use without residual snoring or daytime sleepiness All other systems reviewed and are negative.   Prior CV studies:   The following studies were reviewed today:  ECHO 06/22/2020 IMPRESSIONS  1. Left ventricular ejection fraction, by estimation, is 60 to 65%. Left  ventricular ejection fraction by 3D volume is 62 %. The left ventricle has  normal function. The left ventricle has no regional wall motion  abnormalities. Left ventricular diastolic  parameters were normal.  2. Right  ventricular systolic function is normal. The right ventricular  size is moderately enlarged.  3. Left atrial size was mildly dilated.  4. Right atrial size was moderately dilated.  5. The mitral valve is normal in structure. No evidence of mitral valve  regurgitation. No evidence of mitral stenosis.  6. The aortic valve is normal in structure. Aortic valve regurgitation is  trivial. No aortic stenosis is present. Aortic regurgitation PHT measures  326 msec.  7. The inferior vena cava is normal in size with greater than 50%  respiratory variability, suggesting right atrial pressure of 3 mmHg.   Labs/Other Tests and Data Reviewed:    EKG: Since this was a virtual visit I could not obtain an EKG today but I personally reviewed his last ECG from July 02, 2019 which showed normal sinus rhythm at 89 bpm, PACs, incomplete right bundle branch block and small Q waves in lead III and aVF   Recent Labs: No results found for requested labs within last 8760 hours.   Recent Lipid Panel Lab Results  Component Value Date/Time   CHOL 146 07/04/2017 08:26 AM   TRIG 51 07/04/2017 08:26 AM   HDL 40 07/04/2017 08:26 AM   CHOLHDL 3.7 07/04/2017 08:26 AM   LDLCALC 96 07/04/2017 08:26 AM    Wt Readings from Last 3 Encounters:  07/29/20 219 lb (99.3 kg)  05/26/20 220 lb (99.8 kg)  07/02/19 215 lb (97.5 kg)      Objective:    Vital Signs:  BP 108/60    Pulse 64    Ht 6\' 1"  (1.854 m)    Wt 219 lb (99.3 kg)    BMI 28.89 kg/m    Since this was a virtual visit I could not physically examine the patient. Breathing was normal and not labored There was no wheezing His palpation of his heart rhythm was a regular heart rate in the sixties without irregularity No chest wall tenderness No abdominal tenderness or abdominal pain No swelling Normal affect and mood    ASSESSMENT & PLAN:    1. Palpitations: Mathew Phillips has a history of previously documented atrial tachycardia and remotely had  developed an episode of wide-complex tachycardia.  While in [place] he had undergone prior cardiac catheterization, echo, electrophysiology study as well as cardiac MRI.  He was not found to have any infiltrative disease and he did not have evidence for RV dysplasia.  He underwent successful ablation for atrial fibrillation and remotely had been on flecainide in addition to  metoprolol which ultimately he was able to discontinue.  At his last visit, due to episode of palpitations it was recommended he reinstitute metoprolol succinate.  He has been taking 25 mg daily.  His resting pulse typically is in the sixties.  With significant walking his heart rate increases between 110 and 135 but he denies any recurrent episodes of his previous tachydysrhythmia.  He is not having any anginal symptoms.  I have recommended he continue to take metoprolol 25 mg daily.  I reviewed his most recent echo Doppler study which continued to show normal LV systolic function with EF at 60 to 65%.  There was mild atrial dilation.  RV function was normal but RV was slightly dilated. 2. OSA: He continues to use CPAP with 100% compliance. 3. 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  No recurrent edema. 4. Vitamin D insufficiency: Recently started on supplementation. 5. Remote history of DVT March 2020, stable   COVID-19 Education: The signs and symptoms of COVID-19 were discussed with the patient and how to seek care for testing (follow up with PCP or arrange E-visit). The importance of social distancing was discussed today.  Time:   Today, I have spent 16  minutes with the patient with telehealth technology discussing the above problems.     Medication Adjustments/Labs and Tests Ordered: Current medicines are reviewed at length with the patient today.  Concerns regarding medicines are outlined above.   Tests Ordered: No  orders of the defined types were placed in this encounter.   Medication Changes: No orders of the defined types were placed in this encounter.   Follow Up:  In Person 6 months  Signed, Shelva Majestic, MD  07/29/2020 8:44 AM    [place]

## 2020-07-29 NOTE — Patient Instructions (Signed)
Medication Instructions:  Your physician recommends that you continue on your current medications as directed. Please refer to the Current Medication list given to you today.  *If you need a refill on your cardiac medications before your next appointment, please call your pharmacy*    Follow-Up: At [facility], you and your health needs are our priority.  As part of our continuing mission to provide you with exceptional heart care, we have created designated Provider Care Teams.  These Care Teams include your primary Cardiologist (physician) and Advanced Practice Providers (APPs -  Physician Assistants and Nurse Practitioners) who all work together to provide you with the care you need, when you need it.  We recommend signing up for the patient portal called "MyChart".  Sign up information is provided on this After Visit Summary.  MyChart is used to connect with patients for Virtual Visits (Telemedicine).  Patients are able to view lab/test results, encounter notes, upcoming appointments, etc.  Non-urgent messages can be sent to your provider as well.   To learn more about what you can do with MyChart, go to [email or web address].    Your next appointment:   6 month(s)  The format for your next appointment:   In Person  Provider:   You may see Shelva Majestic, MD or one of the following Advanced Practice Providers on your designated Care Team:    Almyra Deforest, PA-C  Fabian Sharp, PA-C or   Roby Lofts, [place]    Other Instructions

## 2020-08-24 DIAGNOSIS — R350 Frequency of micturition: Secondary | ICD-10-CM | POA: Diagnosis not present

## 2020-08-24 DIAGNOSIS — R3912 Poor urinary stream: Secondary | ICD-10-CM | POA: Diagnosis not present

## 2020-08-24 DIAGNOSIS — N401 Enlarged prostate with lower urinary tract symptoms: Secondary | ICD-10-CM | POA: Diagnosis not present

## 2020-09-01 DIAGNOSIS — D1801 Hemangioma of skin and subcutaneous tissue: Secondary | ICD-10-CM | POA: Diagnosis not present

## 2020-09-01 DIAGNOSIS — B078 Other viral warts: Secondary | ICD-10-CM | POA: Diagnosis not present

## 2020-09-01 DIAGNOSIS — D2371 Other benign neoplasm of skin of right lower limb, including hip: Secondary | ICD-10-CM | POA: Diagnosis not present

## 2020-09-01 DIAGNOSIS — L2089 Other atopic dermatitis: Secondary | ICD-10-CM | POA: Diagnosis not present

## 2020-09-01 DIAGNOSIS — D485 Neoplasm of uncertain behavior of skin: Secondary | ICD-10-CM | POA: Diagnosis not present

## 2020-09-07 ENCOUNTER — Other Ambulatory Visit: Payer: Self-pay

## 2020-09-07 MED ORDER — METOPROLOL SUCCINATE ER 25 MG PO TB24: 25.0000 mg | ORAL_TABLET | Freq: Every day | ORAL | 3 refills | Status: DC

## 2020-09-18 ENCOUNTER — Ambulatory Visit: Payer: BC Managed Care – PPO | Admitting: Cardiovascular Disease

## 2020-10-09 DIAGNOSIS — R350 Frequency of micturition: Secondary | ICD-10-CM | POA: Diagnosis not present

## 2020-10-09 DIAGNOSIS — N401 Enlarged prostate with lower urinary tract symptoms: Secondary | ICD-10-CM | POA: Diagnosis not present

## 2020-10-09 DIAGNOSIS — R3912 Poor urinary stream: Secondary | ICD-10-CM | POA: Diagnosis not present

## 2021-04-13 ENCOUNTER — Ambulatory Visit: Payer: BC Managed Care – PPO | Admitting: General Practice

## 2021-04-19 DIAGNOSIS — R350 Frequency of micturition: Secondary | ICD-10-CM | POA: Diagnosis not present

## 2021-04-19 DIAGNOSIS — N401 Enlarged prostate with lower urinary tract symptoms: Secondary | ICD-10-CM | POA: Diagnosis not present

## 2021-04-19 DIAGNOSIS — R3912 Poor urinary stream: Secondary | ICD-10-CM | POA: Diagnosis not present

## 2021-05-05 ENCOUNTER — Encounter: Payer: Self-pay | Admitting: Orthopaedic Surgery

## 2021-05-05 ENCOUNTER — Ambulatory Visit: Payer: BC Managed Care – PPO | Admitting: Orthopaedic Surgery

## 2021-05-05 ENCOUNTER — Ambulatory Visit: Payer: Self-pay

## 2021-05-05 ENCOUNTER — Other Ambulatory Visit: Payer: Self-pay

## 2021-05-05 DIAGNOSIS — G8929 Other chronic pain: Secondary | ICD-10-CM | POA: Diagnosis not present

## 2021-05-05 DIAGNOSIS — M25562 Pain in left knee: Secondary | ICD-10-CM

## 2021-05-05 NOTE — Progress Notes (Signed)
Office Visit Note   Patient: Mathew Phillips           Date of Birth: 31-Aug-1960           MRN: [id number] Visit Date: 05/05/2021              Requested by: Dulce Sellar, MD [address] [place],  [place] [address] PCP: Dulce Sellar, MD   Assessment & Plan: Visit Diagnoses:  1. Chronic pain of left knee     Plan: Impression is left knee pain and effusion.  I have recommended 6 weeks of RICE.  For the effusion this has significantly improved with just rest and so we decided to not aspirate it.  He will take scheduled NSAIDs as needed.  He will avoid impact activities for 6 weeks and then gradually increase activity as tolerated as long as he feels like he is back to normal.  If not he will let me know so that we can obtain MRI to rule out structural abnormalities.  Follow-Up Instructions: No follow-ups on file.   Orders:  Orders Placed This Encounter  Procedures   XR KNEE 3 VIEW LEFT   No orders of the defined types were placed in this encounter.     Procedures: No procedures performed   Clinical Data: No additional findings.   Subjective: Chief Complaint  Patient presents with   Left Knee - Pain    Mr. Mathew Phillips is a very pleasant 60 year old gentleman here for evaluation of left knee pain and swelling that started about 6 weeks ago.  He  on and his wife are training for a half marathon in [place] next year.  2 weeks ago he was kneeling and felt a pulling sensation around the front of the knee.  He feels occasional locking and he did have swelling which has significantly improved just in the last 2 days of rest.  Denies any joint line tenderness.  Denies any history of gout.   Review of Systems  Constitutional: Negative.   All other systems reviewed and are negative.   Objective: Vital Signs: There were no vitals taken for this visit.  Physical Exam Vitals and nursing note reviewed.  Constitutional:      Appearance: He is well-developed.   Pulmonary:     Effort: Pulmonary effort is normal.  Abdominal:     Palpations: Abdomen is soft.  Skin:    General: Skin is warm.  Neurological:     Mental Status: He is alert and oriented to person, place, and time.  Psychiatric:        Behavior: Behavior normal.        Thought Content: Thought content normal.        Judgment: Judgment normal.    Ortho Exam  Left knee shows residual small joint effusion.  Range of motion is fairly well-preserved and feels a little tight with terminal flexion.  Collaterals and cruciates are stable.  No significant medial or lateral joint line tenderness.  Negative McMurray.  2+ patellofemoral crepitus with range of motion.  Specialty Comments:  No specialty comments available.  Imaging: XR KNEE 3 VIEW LEFT  Result Date: 05/05/2021 Small joint effusion.  Mild osteoarthritis with periarticular spurring.    PMFS History: There are no problems to display for this patient.  Past Medical History:  Diagnosis Date   Atrial fibrillation ([place])    Varicose veins of left lower extremity     Family History  Problem Relation Age of Onset  COPD Mother    Coronary artery disease Mother    Cancer Father     Past Surgical History:  Procedure Laterality Date   ATRIAL FIBRILLATION ABLATION     ENDOVENOUS ABLATION SAPHENOUS VEIN W/ LASER Left 08/09/2018   endovenous laser ablation left greater saphenous vein and stab phlebectomy >20 incisions left leg by Mathew Mayo MD   KNEE SURGERY  both   SHOULDER SURGERY  both   WRIST SURGERY Left    Social History   Occupational History   Not on file  Tobacco Use   Smoking status: Never   Smokeless tobacco: Never  Vaping Use   Vaping Use: Never used  Substance and Sexual Activity   Alcohol use: Yes    Comment: rarely   Drug use: No   Sexual activity: Not on file

## 2021-06-02 NOTE — Progress Notes (Signed)
Cardiology Clinic Note   Patient Name: Mathew Phillips Date of Encounter: 06/03/2021  Primary Care Provider:  Dulce Sellar, MD Primary Cardiologist:  Shelva Majestic, MD  Patient Profile     Mathew Phillips 60 year old male presents the clinic today for follow-up evaluation of his palpitations  Past Medical History    Past Medical History:  Diagnosis Date   Atrial fibrillation ([place])    Varicose veins of left lower extremity    Past Surgical History:  Procedure Laterality Date   ATRIAL FIBRILLATION ABLATION     ENDOVENOUS ABLATION SAPHENOUS VEIN W/ LASER Left 08/09/2018   endovenous laser ablation left greater saphenous vein and stab phlebectomy >20 incisions left leg by Deitra Mayo MD   KNEE SURGERY  both   SHOULDER SURGERY  both   WRIST SURGERY Left     Allergies  Allergies  Allergen Reactions   Yellow Jacket Venom [Bee Venom] Anaphylaxis, Hives and Swelling   Codeine Hives    History of Present Illness    Mathew Phillips is a PMH of atrial tachycardia, OSA on CPAP, A. fib ablation, lower extremity varicose veins status post ablation, and vitamin D insufficiency.  He was previously evaluated 3/14 by Dr. Casimer Bilis at [facility] heart specialist.  He had developed episodes of wide-complex tachycardia that were identified on outpatient monitoring.  He underwent cardiac catheterization, echocardiogram, EPS, and cardiac MRI.  His cardiac catheterization showed an LVEF of 60%, TEE showed normal EF.  His EPS did not reveal any inducible arrhythmia and he underwent successful cardioversion to sinus rhythm.  A cardiac MRI did not reveal infiltrative disease and there was no evidence of RV dysplasia.  He was placed on flecainide and metoprolol.  He underwent W ACA for atrial fibrillation.  He was subsequently hospitalized and he was not aware of any recurrent atrial fibrillation.  He was diagnosed with OSA and started on CPAP.  He established with cardiology in  [place] after moving back from [place].  Dr. Claiborne Billings recommended a follow-up echocardiogram.  It was completed on 1/19.  It showed normal LV systolic and diastolic function with an EF of 55-60%.  His pulmonary pressures were normal.  He followed up 1/21 and continues to do well.  He was continued on his metoprolol.  He had undergone successful laser ablation therapy of his left great saphenous vein and is being followed by Dr. Scot Dock.  He followed up 12/21.  At that time he had noticed to begin fluttering and palpitations with mild shortness of breath.  He denied dizziness.  He reported that when he felt the palpitations he did stop his activity and rest.  Symptoms would resolve.  He denied chest pain.  He reviewed his smart watch and found heart rates in the 180 range.  His metoprolol succinate was reinstituted and he was scheduled for follow-up echocardiogram.  His echocardiogram 1/22 showed normal LV function with an EF of 60-65%, moderate RA and RV dilation with mild left atrial enlargement.  He reported that with the addition of metoprolol he was not noticing further palpitations.  He continued to be active.  He was walking several days a week.  On average she was walking 8000-10000 steps through the week and on weekends 14000-15000.  He denied angina.  He presents to the clinic today for follow-up evaluation states that he feels well.  He has not had palpitations in the last 6 to 8 months.  He is training for the [facility] half marathon in  September.  He has a brother-in-law that lives in [place] and stays with him during the race.  He reports that he and his wife have been training and he has lost around 50 pounds.  He reports that he is following a heart healthy diet.  He does notice that he is not able to increase his effort is much as he used to due to his metoprolol medications.  I will continue with his current medication regimen, continue heart healthy diet, and have him maintain his physical  activity.  We will plan follow-up for 12 months.  Today he denies chest pain, shortness of breath, lower extremity edema, fatigue, palpitations, melena, hematuria, hemoptysis, diaphoresis, weakness, presyncope, syncope, orthopnea, and PND.   Home Medications    Prior to Admission medications   Medication Sig Start Date End Date Taking? Authorizing Provider  metoprolol succinate (TOPROL XL) 25 MG 24 hr tablet Take 1 tablet (25 mg total) by mouth daily. 09/07/20   Troy Sine, MD    Family History    Family History  Problem Relation Age of Onset   COPD Mother    Coronary artery disease Mother    Cancer Father    He indicated that his mother is deceased. He indicated that his father is deceased.  Social History    Social History   Socioeconomic History   Marital status: Married    Spouse name: Not on file   Number of children: Not on file   Years of education: Not on file   Highest education level: Not on file  Occupational History   Not on file  Tobacco Use   Smoking status: Never   Smokeless tobacco: Never  Vaping Use   Vaping Use: Never used  Substance and Sexual Activity   Alcohol use: Yes    Comment: rarely   Drug use: No   Sexual activity: Not on file  Other Topics Concern   Not on file  Social History Narrative   Not on file   Social Determinants of Health   Financial Resource Strain: Not on file  Food Insecurity: Not on file  Transportation Needs: Not on file  Physical Activity: Not on file  Stress: Not on file  Social Connections: Not on file  Intimate Partner Violence: Not on file     Review of Systems    General:  No chills, fever, night sweats or weight changes.  Cardiovascular:  No chest pain, dyspnea on exertion, edema, orthopnea, palpitations, paroxysmal nocturnal dyspnea. Dermatological: No rash, lesions/masses Respiratory: No cough, dyspnea Urologic: No hematuria, dysuria Abdominal:   No nausea, vomiting, diarrhea, bright red blood  per rectum, melena, or hematemesis Neurologic:  No visual changes, wkns, changes in mental status. All other systems reviewed and are otherwise negative except as noted above.  Physical Exam    VS:  BP 110/80 (BP Location: Left Arm)    Pulse 82    Ht 6\' 1"  (1.854 m)    Wt 184 lb 3.2 oz (83.6 kg)    SpO2 96%    BMI 24.30 kg/m  , BMI Body mass index is 24.3 kg/m. GEN: Well nourished, well developed, in no acute distress. HEENT: normal. Neck: Supple, no JVD, carotid bruits, or masses. Cardiac: RRR, no murmurs, rubs, or gallops. No clubbing, cyanosis, edema.  Radials/DP/PT 2+ and equal bilaterally.  Respiratory:  Respirations regular and unlabored, clear to auscultation bilaterally. GI: Soft, nontender, nondistended, BS + x 4. MS: no deformity or atrophy. Skin: warm and dry,  no rash. Neuro:  Strength and sensation are intact. Psych: Normal affect.  Accessory Clinical Findings    Recent Labs: No results found for requested labs within last 8760 hours.   Recent Lipid Panel    Component Value Date/Time   CHOL 146 07/04/2017 0826   TRIG 51 07/04/2017 0826   HDL 40 07/04/2017 0826   CHOLHDL 3.7 07/04/2017 0826   LDLCALC 96 07/04/2017 0826    ECG personally reviewed by me today-normal sinus rhythm possible left atrial enlargement incomplete right bundle branch block 65 bpm- No acute changes  Echocardiogram 06/22/2020  IMPRESSIONS     1. Left ventricular ejection fraction, by estimation, is 60 to 65%. Left  ventricular ejection fraction by 3D volume is 62 %. The left ventricle has  normal function. The left ventricle has no regional wall motion  abnormalities. Left ventricular diastolic   parameters were normal.   2. Right ventricular systolic function is normal. The right ventricular  size is moderately enlarged.   3. Left atrial size was mildly dilated.   4. Right atrial size was moderately dilated.   5. The mitral valve is normal in structure. No evidence of mitral valve   regurgitation. No evidence of mitral stenosis.   6. The aortic valve is normal in structure. Aortic valve regurgitation is  trivial. No aortic stenosis is present. Aortic regurgitation PHT measures  326 msec.   7. The inferior vena cava is normal in size with greater than 50%  respiratory variability, suggesting right atrial pressure of 3 mmHg.   Assessment & Plan   1.  Palpitations-denies recent episodes of accelerated or irregular heart rate.  Last palpitations were 6 to 8 months ago.  Continues to be physically active and is training for the [employer] half marathon in September. Continue metoprolol Heart healthy low-sodium diet Increase physical activity as tolerated Avoid triggers caffeine, chocolate, EtOH, dehydration etc.  Venous insufficiency/bilateral lower extremity varicosities-euvolemic today.  Status post laser treatment for a left great saphenous vein. Lower extremity support stockings Elevate lower extremities when not active Maintain physical activity  Remote history of DVT-no recent episodes of lower extremity swelling or erythema. Maintain physical activity Lower extremity support stockings Continue to monitor  Disposition: Follow-up with Dr. Claiborne Billings or me in 12 months.   Jossie Ng. Quron Ruddy NP-C    06/03/2021, 8:46 AM [address] Office [phone] Fax [phone]  Notice: This dictation was prepared with Dragon dictation along with smaller phrase technology. Any transcriptional errors that result from this process are unintentional and may not be corrected upon review.  I spent 14 minutes examining this patient, reviewing medications, and using patient centered shared decision making involving her cardiac care.  Prior to her visit I spent greater than 20 minutes reviewing her past medical history,  medications, and prior cardiac tests.

## 2021-06-03 ENCOUNTER — Ambulatory Visit: Payer: BC Managed Care – PPO | Admitting: General Practice

## 2021-06-03 ENCOUNTER — Encounter: Payer: Self-pay | Admitting: General Practice

## 2021-06-03 ENCOUNTER — Other Ambulatory Visit: Payer: Self-pay

## 2021-06-03 VITALS — BP 110/80 | HR 82 | Ht 73.0 in | Wt 184.2 lb

## 2021-06-03 DIAGNOSIS — G4733 Obstructive sleep apnea (adult) (pediatric): Secondary | ICD-10-CM

## 2021-06-03 DIAGNOSIS — I872 Venous insufficiency (chronic) (peripheral): Secondary | ICD-10-CM | POA: Diagnosis not present

## 2021-06-03 DIAGNOSIS — Z86718 Personal history of other venous thrombosis and embolism: Secondary | ICD-10-CM

## 2021-06-03 DIAGNOSIS — R002 Palpitations: Secondary | ICD-10-CM

## 2021-06-03 NOTE — Patient Instructions (Signed)
Medication Instructions:  The current medical regimen is effective;  continue present plan and medications as directed. Please refer to the Current Medication list given to you today.   *If you need a refill on your cardiac medications before your next appointment, please call your pharmacy*  Lab Work:   Testing/Procedures:  NONE    NONE  Special Instructions NO OTHER CHANGES  Follow-Up: Your next appointment:  1 year(s) In Person with Shelva Majestic, MD   Please call our office 2 months in advance to schedule this appointment   At [facility], you and your health needs are our priority.  As part of our continuing mission to provide you with exceptional heart care, we have created designated Provider Care Teams.  These Care Teams include your primary Cardiologist (physician) and Advanced Practice Providers (APPs -  Physician Assistants and Nurse Practitioners) who all work together to provide you with the care you need, when you need it.

## 2021-07-09 DIAGNOSIS — Z713 Dietary counseling and surveillance: Secondary | ICD-10-CM | POA: Diagnosis not present

## 2021-09-13 ENCOUNTER — Other Ambulatory Visit: Payer: Self-pay

## 2021-09-13 MED ORDER — METOPROLOL SUCCINATE ER 25 MG PO TB24: 25.0000 mg | ORAL_TABLET | Freq: Every day | ORAL | 3 refills | Status: DC

## 2021-10-08 DIAGNOSIS — Z6824 Body mass index (BMI) 24.0-24.9, adult: Secondary | ICD-10-CM | POA: Diagnosis not present

## 2021-10-08 DIAGNOSIS — Z0130 Encounter for examination of blood pressure without abnormal findings: Secondary | ICD-10-CM | POA: Diagnosis not present

## 2021-10-08 DIAGNOSIS — N400 Enlarged prostate without lower urinary tract symptoms: Secondary | ICD-10-CM | POA: Diagnosis not present

## 2021-10-08 DIAGNOSIS — E041 Nontoxic single thyroid nodule: Secondary | ICD-10-CM | POA: Diagnosis not present

## 2021-10-08 DIAGNOSIS — I10 Essential (primary) hypertension: Secondary | ICD-10-CM | POA: Diagnosis not present

## 2021-10-08 DIAGNOSIS — Z79899 Other long term (current) drug therapy: Secondary | ICD-10-CM | POA: Diagnosis not present

## 2021-10-08 DIAGNOSIS — E039 Hypothyroidism, unspecified: Secondary | ICD-10-CM | POA: Diagnosis not present

## 2021-10-08 DIAGNOSIS — E7800 Pure hypercholesterolemia, unspecified: Secondary | ICD-10-CM | POA: Diagnosis not present

## 2021-10-11 DIAGNOSIS — R3912 Poor urinary stream: Secondary | ICD-10-CM | POA: Diagnosis not present

## 2021-10-11 DIAGNOSIS — N401 Enlarged prostate with lower urinary tract symptoms: Secondary | ICD-10-CM | POA: Diagnosis not present

## 2021-10-29 DIAGNOSIS — I10 Essential (primary) hypertension: Secondary | ICD-10-CM | POA: Diagnosis not present

## 2021-10-29 DIAGNOSIS — N400 Enlarged prostate without lower urinary tract symptoms: Secondary | ICD-10-CM | POA: Diagnosis not present

## 2021-10-29 DIAGNOSIS — E041 Nontoxic single thyroid nodule: Secondary | ICD-10-CM | POA: Diagnosis not present

## 2021-10-29 DIAGNOSIS — E7800 Pure hypercholesterolemia, unspecified: Secondary | ICD-10-CM | POA: Diagnosis not present

## 2021-10-29 DIAGNOSIS — Z0130 Encounter for examination of blood pressure without abnormal findings: Secondary | ICD-10-CM | POA: Diagnosis not present

## 2021-10-29 DIAGNOSIS — Z6824 Body mass index (BMI) 24.0-24.9, adult: Secondary | ICD-10-CM | POA: Diagnosis not present

## 2021-11-11 ENCOUNTER — Other Ambulatory Visit: Payer: Self-pay | Admitting: Family Medicine

## 2021-11-11 DIAGNOSIS — E042 Nontoxic multinodular goiter: Secondary | ICD-10-CM

## 2021-11-18 ENCOUNTER — Ambulatory Visit
Admission: RE | Admit: 2021-11-18 | Discharge: 2021-11-18 | Disposition: A | Discharge status: Home / Self Care | Payer: Self-pay | Source: Ambulatory Visit | Attending: Family Medicine | Admitting: Family Medicine

## 2021-11-18 ENCOUNTER — Other Ambulatory Visit: Payer: Self-pay | Admitting: Family Medicine

## 2021-11-18 DIAGNOSIS — E042 Nontoxic multinodular goiter: Secondary | ICD-10-CM

## 2021-11-29 ENCOUNTER — Ambulatory Visit
Admission: RE | Admit: 2021-11-29 | Discharge: 2021-11-29 | Disposition: A | Discharge status: Home / Self Care | Payer: BC Managed Care – PPO | Source: Ambulatory Visit | Attending: Family Medicine | Admitting: Family Medicine

## 2021-11-29 ENCOUNTER — Other Ambulatory Visit (HOSPITAL_COMMUNITY)
Admission: RE | Admit: 2021-11-29 | Discharge: 2021-11-29 | Disposition: A | Discharge status: Home / Self Care | Payer: BC Managed Care – PPO | Source: Ambulatory Visit | Attending: Family Medicine | Admitting: Family Medicine

## 2021-11-29 DIAGNOSIS — E042 Nontoxic multinodular goiter: Secondary | ICD-10-CM | POA: Diagnosis not present

## 2021-11-29 DIAGNOSIS — E041 Nontoxic single thyroid nodule: Secondary | ICD-10-CM | POA: Diagnosis not present

## 2021-11-30 LAB — CYTOLOGY - NON PAP

## 2022-04-04 DIAGNOSIS — E042 Nontoxic multinodular goiter: Secondary | ICD-10-CM | POA: Diagnosis not present

## 2022-04-07 DIAGNOSIS — R490 Dysphonia: Secondary | ICD-10-CM | POA: Diagnosis not present

## 2022-04-07 DIAGNOSIS — D141 Benign neoplasm of larynx: Secondary | ICD-10-CM | POA: Diagnosis not present

## 2022-04-18 ENCOUNTER — Other Ambulatory Visit: Payer: Self-pay | Admitting: Otolaryngology

## 2022-05-31 ENCOUNTER — Ambulatory Visit: Payer: BC Managed Care – PPO | Admitting: General Practice

## 2022-06-21 ENCOUNTER — Other Ambulatory Visit: Payer: Self-pay

## 2022-06-21 ENCOUNTER — Encounter (HOSPITAL_COMMUNITY): Payer: Self-pay | Admitting: Otolaryngology

## 2022-06-21 NOTE — Progress Notes (Signed)
PCP - Dulce Sellar, MD Cardiologist - Shelva Majestic, MD  EKG - DOS ECHO - 06/22/21  CPAP - Wears every night  Aspirin Instructions: Last dose 06/21/22  ERAS Protcol - Clears until 0945  COVID TEST- DOS  Anesthesia review: N  Patient verbally denies any shortness of breath, fever, cough and chest pain during phone call   -------------  SDW INSTRUCTIONS given:  Your procedure is scheduled on 06/22/22.  Report to [facility] Main Entrance "A" at [address] and check in at the Admitting office.  Call this number if you have problems the morning of surgery:  [phone]   Remember:  Do not eat after midnight the night before your surgery  You may drink clear liquids until 0945 the morning of your surgery.   Clear liquids allowed are: Water, Non-Citrus Juices (without pulp), Carbonated Beverages, Clear Tea, Black Coffee Only, and Gatorade    Take these medicines the morning of surgery with A SIP OF WATER  metoprolol succinate (TOPROL XL)  tamsulosin (FLOMAX)   As of today, STOP taking any Aspirin (unless otherwise instructed by your surgeon) Aleve, Naproxen, Ibuprofen, Motrin, Advil, Goody's, BC's, all herbal medications, fish oil, and all vitamins.                      Do not wear jewelry, make up, or nail polish            Do not wear lotions, powders, perfumes/colognes, or deodorant.            Do not shave 48 hours prior to surgery.  Men may shave face and neck.            Do not bring valuables to the hospital.            [facility] is not responsible for any belongings or valuables.  Do NOT Smoke (Tobacco/Vaping) 24 hours prior to your procedure If you use a CPAP at night, you may bring all equipment for your overnight stay.   Contacts, glasses, dentures or bridgework may not be worn into surgery.      For patients admitted to the hospital, discharge time will be determined by your treatment team.   Patients discharged the day of surgery will not be allowed to  drive home, and someone needs to stay with them for 24 hours.    Special instructions:   [place]- Preparing For Surgery  Before surgery, you can play an important role. Because skin is not sterile, your skin needs to be as free of germs as possible. You can reduce the number of germs on your skin by washing with CHG (chlorahexidine gluconate) Soap before surgery.  CHG is an antiseptic cleaner which kills germs and bonds with the skin to continue killing germs even after washing.    Oral Hygiene is also important to reduce your risk of infection.  Remember - BRUSH YOUR TEETH THE MORNING OF SURGERY WITH YOUR REGULAR TOOTHPASTE  Please do not use if you have an allergy to CHG or antibacterial soaps. If your skin becomes reddened/irritated stop using the CHG.  Do not shave (including legs and underarms) for at least 48 hours prior to first CHG shower. It is OK to shave your face.  Please follow these instructions carefully.   Shower the NIGHT BEFORE SURGERY and the MORNING OF SURGERY with DIAL Soap.   Pat yourself dry with a CLEAN TOWEL.  Wear CLEAN PAJAMAS to bed the night before  surgery  Place CLEAN SHEETS on your bed the night of your first shower and DO NOT SLEEP WITH PETS.   Day of Surgery: Please shower morning of surgery  Wear Clean/Comfortable clothing the morning of surgery Do not apply any deodorants/lotions.   Remember to brush your teeth WITH YOUR REGULAR TOOTHPASTE.   Questions were answered. Patient verbalized understanding of instructions.

## 2022-06-21 NOTE — Progress Notes (Signed)
Spoke with the pt, he will arrive tom at 0910.

## 2022-06-22 ENCOUNTER — Other Ambulatory Visit: Payer: Self-pay

## 2022-06-22 ENCOUNTER — Encounter (HOSPITAL_COMMUNITY): Payer: Self-pay | Admitting: Otolaryngology

## 2022-06-22 ENCOUNTER — Ambulatory Visit (HOSPITAL_COMMUNITY): Payer: BC Managed Care – PPO | Admitting: Certified Registered Nurse Anesthetist

## 2022-06-22 ENCOUNTER — Encounter (HOSPITAL_COMMUNITY)
Admission: RE | Disposition: A | Discharge status: Home / Self Care | Payer: Self-pay | Source: Home / Self Care | Attending: Otolaryngology

## 2022-06-22 ENCOUNTER — Ambulatory Visit (HOSPITAL_COMMUNITY)
Admission: RE | Admit: 2022-06-22 | Discharge: 2022-06-22 | Disposition: A | Discharge status: Home / Self Care | Payer: BC Managed Care – PPO | Attending: Otolaryngology | Admitting: Otolaryngology

## 2022-06-22 DIAGNOSIS — R490 Dysphonia: Secondary | ICD-10-CM | POA: Diagnosis not present

## 2022-06-22 DIAGNOSIS — D141 Benign neoplasm of larynx: Secondary | ICD-10-CM | POA: Insufficient documentation

## 2022-06-22 DIAGNOSIS — Z1152 Encounter for screening for COVID-19: Secondary | ICD-10-CM | POA: Insufficient documentation

## 2022-06-22 DIAGNOSIS — Z86018 Personal history of other benign neoplasm: Secondary | ICD-10-CM

## 2022-06-22 DIAGNOSIS — G4730 Sleep apnea, unspecified: Secondary | ICD-10-CM | POA: Diagnosis not present

## 2022-06-22 DIAGNOSIS — I10 Essential (primary) hypertension: Secondary | ICD-10-CM | POA: Diagnosis not present

## 2022-06-22 HISTORY — DX: Sleep apnea, unspecified: G47.30

## 2022-06-22 HISTORY — DX: Personal history of other benign neoplasm: Z86.018

## 2022-06-22 HISTORY — DX: Nontoxic multinodular goiter: E04.2

## 2022-06-22 HISTORY — PX: MICROLARYNGOSCOPY WITH CO2 LASER AND EXCISION OF VOCAL CORD LESION: SHX5970

## 2022-06-22 LAB — BASIC METABOLIC PANEL
Anion gap: 12 (ref 5–15)
BUN: 15 mg/dL (ref 6–20)
CO2: 22 mmol/L (ref 22–32)
Calcium: 8.7 mg/dL — ABNORMAL LOW (ref 8.9–10.3)
Chloride: 106 mmol/L (ref 98–111)
Creatinine, Ser: 0.92 mg/dL (ref 0.61–1.24)
GFR, Estimated: >60 mL/min (ref >60)
Glucose, Bld: 88 mg/dL (ref 70–99)
Potassium: 3.8 mmol/L (ref 3.5–5.1)
Sodium: 140 mmol/L (ref 135–145)

## 2022-06-22 LAB — CBC
HCT: 41.7 % (ref 39.0–52.0)
Hemoglobin: 13.1 g/dL (ref 13.0–17.0)
MCH: 29 pg (ref 26.0–34.0)
MCHC: 31.4 g/dL (ref 30.0–36.0)
MCV: 92.3 fL (ref 80.0–100.0)
Platelets: 236 10*3/uL (ref 150–400)
RBC: 4.52 MIL/uL (ref 4.22–5.81)
RDW: 13.3 % (ref 11.5–15.5)
WBC: 6 10*3/uL (ref 4.0–10.5)
nRBC: 0 % (ref 0.0–0.2)

## 2022-06-22 LAB — SARS CORONAVIRUS 2 BY RT PCR: SARS Coronavirus 2 by RT PCR: NEGATIVE

## 2022-06-22 SURGERY — MICROLARYNGOSCOPY WITH CO2 LASER AND EXCISION OF VOCAL CORD LESION
Anesthesia: General | Site: Throat | Laterality: Bilateral

## 2022-06-22 MED ORDER — SUGAMMADEX SODIUM 200 MG/2ML IV SOLN
INTRAVENOUS | Status: DC | PRN
  Administered 2022-06-22: 200 mg via INTRAVENOUS

## 2022-06-22 MED ORDER — PROPOFOL 10 MG/ML IV BOLUS
INTRAVENOUS | Status: DC | PRN
  Administered 2022-06-22: 160 mg via INTRAVENOUS

## 2022-06-22 MED ORDER — LIDOCAINE 2% (20 MG/ML) 5 ML SYRINGE
INTRAMUSCULAR | Status: DC | PRN
  Administered 2022-06-22: 60 mg via INTRAVENOUS

## 2022-06-22 MED ORDER — EPINEPHRINE HCL (NASAL) 0.1 % NA SOLN
NASAL | Status: AC
  Filled 2022-06-22: qty 30

## 2022-06-22 MED ORDER — DEXAMETHASONE SODIUM PHOSPHATE 4 MG/ML IJ SOLN
INTRAMUSCULAR | Status: DC | PRN
  Administered 2022-06-22: 8 mg via INTRAVENOUS

## 2022-06-22 MED ORDER — ROCURONIUM BROMIDE 100 MG/10ML IV SOLN
INTRAVENOUS | Status: DC | PRN
  Administered 2022-06-22: 60 mg via INTRAVENOUS

## 2022-06-22 MED ORDER — ORAL CARE MOUTH RINSE
15.0000 mL | Freq: Once | OROMUCOSAL | Status: AC
Start: 2022-06-22 — End: 2022-06-22

## 2022-06-22 MED ORDER — PROPOFOL 500 MG/50ML IV EMUL
INTRAVENOUS | Status: AC
  Filled 2022-06-22: qty 50

## 2022-06-22 MED ORDER — FENTANYL CITRATE (PF) 100 MCG/2ML IJ SOLN: 25.0000 ug | INTRAMUSCULAR | Status: DC | PRN

## 2022-06-22 MED ORDER — PROPOFOL 500 MG/50ML IV EMUL
INTRAVENOUS | Status: DC | PRN
  Administered 2022-06-22: 150 ug/kg/min via INTRAVENOUS

## 2022-06-22 MED ORDER — CEFAZOLIN SODIUM-DEXTROSE 2-4 GM/100ML-% IV SOLN
2.0000 g | INTRAVENOUS | Status: AC
  Administered 2022-06-22: 2 g via INTRAVENOUS
  Filled 2022-06-22: qty 100

## 2022-06-22 MED ORDER — CHLORHEXIDINE GLUCONATE 0.12 % MT SOLN
15.0000 mL | Freq: Once | OROMUCOSAL | Status: AC
Start: 2022-06-22 — End: 2022-06-22
  Administered 2022-06-22: 15 mL via OROMUCOSAL
  Filled 2022-06-22: qty 15

## 2022-06-22 MED ORDER — PHENYLEPHRINE HCL-NACL 20-0.9 MG/250ML-% IV SOLN
INTRAVENOUS | Status: AC
  Filled 2022-06-22: qty 250

## 2022-06-22 MED ORDER — LACTATED RINGERS IV SOLN
INTRAVENOUS | Status: DC
Start: 2022-06-22 — End: 2022-06-22

## 2022-06-22 MED ORDER — MIDAZOLAM HCL 2 MG/2ML IJ SOLN
INTRAMUSCULAR | Status: AC
  Filled 2022-06-22: qty 2

## 2022-06-22 MED ORDER — MIDAZOLAM HCL 5 MG/5ML IJ SOLN
INTRAMUSCULAR | Status: DC | PRN
  Administered 2022-06-22: 2 mg via INTRAVENOUS

## 2022-06-22 MED ORDER — EPINEPHRINE PF 1 MG/ML IJ SOLN
INTRAMUSCULAR | Status: DC | PRN
  Administered 2022-06-22: 10 mg

## 2022-06-22 MED ORDER — ACETAMINOPHEN 10 MG/ML IV SOLN: 1000.0000 mg | Freq: Once | INTRAVENOUS | Status: DC | PRN

## 2022-06-22 MED ORDER — ONDANSETRON HCL 4 MG/2ML IJ SOLN
INTRAMUSCULAR | Status: DC | PRN
  Administered 2022-06-22: 4 mg via INTRAVENOUS

## 2022-06-22 MED ORDER — 0.9 % SODIUM CHLORIDE (POUR BTL) OPTIME
TOPICAL | Status: DC | PRN
  Administered 2022-06-22: 1000 mL

## 2022-06-22 MED ORDER — LIDOCAINE 2% (20 MG/ML) 5 ML SYRINGE: INTRAMUSCULAR | Status: DC | PRN

## 2022-06-22 MED ORDER — FENTANYL CITRATE (PF) 100 MCG/2ML IJ SOLN
INTRAMUSCULAR | Status: DC | PRN
  Administered 2022-06-22: 100 ug via INTRAVENOUS

## 2022-06-22 MED ORDER — FENTANYL CITRATE (PF) 250 MCG/5ML IJ SOLN
INTRAMUSCULAR | Status: AC
  Filled 2022-06-22: qty 5

## 2022-06-22 SURGICAL SUPPLY — 29 items
BAG COUNTER SPONGE SURGICOUNT (BAG) ×1 IMPLANT
BAG SPNG CNTER NS LX DISP (BAG)
BLADE SURG 15 STRL LF DISP TIS (BLADE) IMPLANT
BLADE SURG 15 STRL SS (BLADE)
BNDG EYE OVAL (GAUZE/BANDAGES/DRESSINGS) ×2 IMPLANT
CANISTER SUCT 3000ML PPV (MISCELLANEOUS) ×1 IMPLANT
CNTNR URN SCR LID CUP LEK RST (MISCELLANEOUS) IMPLANT
CONT SPEC 4OZ STRL OR WHT (MISCELLANEOUS) ×1
COVER BACK TABLE 60X90IN (DRAPES) ×1 IMPLANT
COVER MAYO STAND STRL (DRAPES) ×1 IMPLANT
DRAPE HALF SHEET 40X57 (DRAPES) ×1 IMPLANT
GAUZE SPONGE 4X4 12PLY STRL (GAUZE/BANDAGES/DRESSINGS) ×1 IMPLANT
GLOVE BIO SURGEON STRL SZ7.5 (GLOVE) ×1 IMPLANT
GOWN STRL REUS W/ TWL LRG LVL3 (GOWN DISPOSABLE) IMPLANT
GOWN STRL REUS W/TWL LRG LVL3 (GOWN DISPOSABLE)
KIT BASIN OR (CUSTOM PROCEDURE TRAY) ×1 IMPLANT
KIT TURNOVER KIT B (KITS) ×1 IMPLANT
NDL HYPO 25GX1X1/2 BEV (NEEDLE) IMPLANT
NEEDLE HYPO 25GX1X1/2 BEV (NEEDLE) IMPLANT
NS IRRIG 1000ML POUR BTL (IV SOLUTION) ×1 IMPLANT
PAD ARMBOARD 7.5X6 YLW CONV (MISCELLANEOUS) ×2 IMPLANT
PATTIES SURGICAL .5 X1 (DISPOSABLE) ×1 IMPLANT
PATTIES SURGICAL .5 X3 (DISPOSABLE) ×1 IMPLANT
POSITIONER HEAD DONUT 9IN (MISCELLANEOUS) IMPLANT
SOL ANTI FOG 6CC (MISCELLANEOUS) ×1 IMPLANT
SURGILUBE 2OZ TUBE FLIPTOP (MISCELLANEOUS) IMPLANT
TOWEL GREEN STERILE FF (TOWEL DISPOSABLE) ×1 IMPLANT
TUBE CONNECTING 12X1/4 (SUCTIONS) ×1 IMPLANT
WATER STERILE IRR 1000ML POUR (IV SOLUTION) ×1 IMPLANT

## 2022-06-22 NOTE — Anesthesia Procedure Notes (Signed)
Procedure Name: General with mask airway Date/Time: 06/22/2022 11:47 AM  Performed by: Lieutenant Diego, CRNAPre-anesthesia Checklist: Patient identified, Emergency Drugs available, Suction available, Patient being monitored and Timeout performed Patient Re-evaluated:Patient Re-evaluated prior to induction Oxygen Delivery Method: Circle system utilized Preoxygenation: Pre-oxygenation with 100% oxygen Induction Type: IV induction Ventilation: Mask ventilation without difficulty

## 2022-06-22 NOTE — Transfer of Care (Signed)
Immediate Anesthesia Transfer of Care Note  Patient: Mathew Phillips  Procedure(s) Performed: MICROLARYNGOSCOPY WITH CO2 LASER AND EXCISION OF VOCAL CORD LESION (Bilateral: Throat)  Patient Location: PACU  Anesthesia Type:General  Level of Consciousness: awake  Airway & Oxygen Therapy: Patient Spontanous Breathing and Patient connected to nasal cannula oxygen  Post-op Assessment: Report given to RN and Post -op Vital signs reviewed and stable  Post vital signs: Reviewed and stable  Last Vitals:  Vitals Value Taken Time  BP 115/65 06/22/22 1212  Temp    Pulse 81 06/22/22 1214  Resp 15 06/22/22 1214  SpO2 98 % 06/22/22 1214  Vitals shown include unvalidated device data.  Last Pain:  Vitals:   06/22/22 0945  TempSrc:   PainSc: 0-No pain         Complications: No notable events documented.

## 2022-06-22 NOTE — Anesthesia Postprocedure Evaluation (Signed)
Anesthesia Post Note  Patient: Mathew Phillips  Procedure(s) Performed: MICROLARYNGOSCOPY WITH CO2 LASER AND EXCISION OF VOCAL CORD LESION (Bilateral: Throat)     Patient location during evaluation: PACU Anesthesia Type: General Level of consciousness: awake and alert Pain management: pain level controlled Vital Signs Assessment: post-procedure vital signs reviewed and stable Respiratory status: spontaneous breathing, nonlabored ventilation, respiratory function stable and patient connected to nasal cannula oxygen Cardiovascular status: blood pressure returned to baseline and stable Postop Assessment: no apparent nausea or vomiting Anesthetic complications: no   No notable events documented.  Last Vitals:  Vitals:   06/22/22 1230 06/22/22 1245  BP: 119/73 114/72  Pulse: (!) 58 (!) 53  Resp: 13 20  Temp:  (!) 36.4 C  SpO2: 100% 100%    Last Pain:  Vitals:   06/22/22 1245  TempSrc:   PainSc: 0-No pain                 Belenda Cruise P Tarun Patchell

## 2022-06-22 NOTE — Anesthesia Preprocedure Evaluation (Signed)
Anesthesia Evaluation  Patient identified by MRN, date of birth, ID band Patient awake    Reviewed: Allergy & Precautions, NPO status , Patient's Chart, lab work & pertinent test results  Airway Mallampati: II  TM Distance: >3 FB Neck ROM: Full    Dental no notable dental hx.    Pulmonary sleep apnea    Pulmonary exam normal        Cardiovascular hypertension, Pt. on medications and Pt. on home beta blockers  Rhythm:Regular Rate:Normal     Neuro/Psych negative neurological ROS  negative psych ROS   GI/Hepatic negative GI ROS, Neg liver ROS,,,  Endo/Other  negative endocrine ROS    Renal/GU negative Renal ROS  negative genitourinary   Musculoskeletal negative musculoskeletal ROS (+)    Abdominal Normal abdominal exam  (+)   Peds  Hematology negative hematology ROS (+)   Anesthesia Other Findings Laryngeal papilloma   Reproductive/Obstetrics                             Anesthesia Physical Anesthesia Plan  ASA: 3  Anesthesia Plan: General   Post-op Pain Management:    Induction: Intravenous  PONV Risk Score and Plan: 2 and Ondansetron, Dexamethasone, Midazolam and Treatment may vary due to age or medical condition  Airway Management Planned: Mask  Additional Equipment: None  Intra-op Plan:   Post-operative Plan:   Informed Consent: I have reviewed the patients History and Physical, chart, labs and discussed the procedure including the risks, benefits and alternatives for the proposed anesthesia with the patient or authorized representative who has indicated his/her understanding and acceptance.     Dental advisory given  Plan Discussed with: CRNA  Anesthesia Plan Comments: (JET VENTILATION)       Anesthesia Quick Evaluation

## 2022-06-22 NOTE — Op Note (Signed)
PREOPERATIVE DIAGNOSIS:  Hoarseness and vocal cord papilloma   POSTOPERATIVE DIAGNOSIS:  Hoarseness and vocal cord papilloma   PROCEDURE:  Suspended microdirect laryngoscopy with CO2 laser excision of bilateral vocal fold lesion   SURGEON:  Melida Quitter, MD   ANESTHESIA:  General with jet ventilation by anesthesia.   COMPLICATIONS:  None.   INDICATIONS:  The patient is a 62 year old male with a history of hoarseness and glottic papilloma.  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  He presents to the operating room for surgical management.   FINDINGS:  Primary nest of papilloma was on the right anterior vocal fold with a smaller nest on left posterior vocal fold.   DESCRIPTION OF PROCEDURE:  The patient was identified in the holding room, informed consent having been obtained including discussion of risks, benefits and alternatives, the patient was brought to the operative suite and put the operative table in the supine position.  Anesthesia was induced and the patient was maintained via mask ventilation.  The eyes were taped closed and bed was turned 90 degrees from anesthesia.  The patient was given intravenous steroids during the  case.  A tooth guard was placed over the upper teeth and a Stortz laryngoscope was placed into the supraglottic position and suspended to Mayo stand using the Lewy arm.  Jet ventilation was initiated.  Damp eye pads were taped over the eyes and a damp towel was placed over the face.  Photodocumentation was performed with the zero degree telescope.  An epinephrine-soaked pledget was held against the lesion for a minute or so while holding ventilation.  The operating microscope was brought into the field and was used to evaluate the larynx.  Some of the right vocal fold lesion was collected with cup forceps and passed to nursing for pathology.  The remaining abnormal tissue was  ablated using the CO2 laser on a setting of 4 watts with the beam defocused.  This was started with the left posterior lesion and then continued to the right anterior lesion.  The zero degree telescope was used at points to look closely.  An epinephrine-soaked pledget was again held against the site for a minute or so.  Photodocumentation was repeated.  The larynx was sprayed with topical lidocaine.  The laryngoscope was then taking out of suspension and removed from the patient's mouth while suctioning the airway.  The tooth guard was removed and the patient was turned back to anesthesia for wakeup and taken to the recovery room in stable condition.

## 2022-06-22 NOTE — H&P (Signed)
Mathew Phillips is an 62 y.o. male.   Chief Complaint: Hoarseness HPI: 62 year old with hoarseness for the past six months.  He underwent removal of vocal fold papilloma in 1988.  Fiberoptic exam demonstrated recurrence.  Past Medical History:  Diagnosis Date   Atrial fibrillation ([place])    Multiple thyroid nodules    benign   Sleep apnea    Varicose veins of left lower extremity     Past Surgical History:  Procedure Laterality Date   ATRIAL FIBRILLATION ABLATION     ENDOVENOUS ABLATION SAPHENOUS VEIN W/ LASER Left 08/09/2018   endovenous laser ablation left greater saphenous vein and stab phlebectomy >20 incisions left leg by Deitra Mayo MD   KNEE SURGERY  both   SHOULDER SURGERY  both   WRIST SURGERY Left     Family History  Problem Relation Age of Onset   COPD Mother    Coronary artery disease Mother    Cancer Father    Social History:  reports that he has never smoked. He has never used smokeless tobacco. He reports current alcohol use. He reports that he does not use drugs.  Allergies:  Allergies  Allergen Reactions   Yellow Jacket Venom [Bee Venom] Anaphylaxis, Hives and Swelling   Codeine Hives and Rash    Medications Prior to Admission  Medication Sig Dispense Refill   aspirin EC 81 MG tablet Take 81 mg by mouth daily. Swallow whole.     metoprolol succinate (TOPROL XL) 25 MG 24 hr tablet Take 1 tablet (25 mg total) by mouth daily. 90 tablet 3   Multiple Vitamin (MULTIVITAMIN WITH MINERALS) TABS tablet Take 1 tablet by mouth daily.     tamsulosin (FLOMAX) 0.4 MG CAPS capsule Take 0.8 mg by mouth daily.      Results for orders placed or performed during the hospital encounter of 06/22/22 (from the past 48 hour(s))  SARS Coronavirus 2 by RT PCR (hospital order, performed in [facility] hospital lab) *cepheid single result test* Anterior Nasal Swab     Status: None   Collection Time: 06/22/22  9:18 AM   Specimen: Anterior Nasal Swab  Result Value Ref  Range   SARS Coronavirus 2 by RT PCR NEGATIVE NEGATIVE    Comment: (NOTE) SARS-CoV-2 target nucleic acids are NOT DETECTED.  The SARS-CoV-2 RNA is generally detectable in upper and lower respiratory specimens during the acute phase of infection. The lowest concentration of SARS-CoV-2 viral copies this assay can detect is 250 copies / mL. A negative result does not preclude SARS-CoV-2 infection and should not be used as the sole basis for treatment or other patient management decisions.  A negative result may occur with improper specimen collection / handling, submission of specimen other than nasopharyngeal swab, presence of viral mutation(s) within the areas targeted by this assay, and inadequate number of viral copies (<250 copies / mL). A negative result must be combined with clinical observations, patient history, and epidemiological information.  Fact Sheet for Patients:   https://www.patel.info/  Fact Sheet for Healthcare Providers: https://hall.com/  This test is not yet approved or  cleared by the [place] FDA and has been authorized for detection and/or diagnosis of SARS-CoV-2 by FDA under an Emergency Use Authorization (EUA).  This EUA will remain in effect (meaning this test can be used) for the duration of the COVID-19 declaration under Section 564(b)(1) of the Act, 21 U.S.C. section 360bbb-3(b)(1), unless the authorization is terminated or revoked sooner.  Performed at [facility], [place] [address] [place], [place] [address]   CBC per protocol     Status: None   Collection Time: 06/22/22 10:19 AM  Result Value Ref Range   WBC 6.0 4.0 - 10.5 K/uL   RBC 4.52 4.22 - 5.81 MIL/uL   Hemoglobin 13.1 13.0 - 17.0 g/dL   HCT 41.7 39.0 - 52.0 %   MCV 92.3 80.0 - 100.0 fL   MCH 29.0 26.0 - 34.0 pg   MCHC 31.4 30.0 - 36.0 g/dL   RDW 13.3 11.5 - 15.5 %   Platelets 236 150 - 400 K/uL   nRBC 0.0 0.0 - 0.2 %    Comment:  Performed at [facility], [place] [address] [place], [place] [address]  Basic metabolic panel per protocol     Status: Abnormal   Collection Time: 06/22/22 10:19 AM  Result Value Ref Range   Sodium 140 135 - 145 mmol/L   Potassium 3.8 3.5 - 5.1 mmol/L   Chloride 106 98 - 111 mmol/L   CO2 22 22 - 32 mmol/L   Glucose, Bld 88 70 - 99 mg/dL    Comment: Glucose reference range applies only to samples taken after fasting for at least 8 hours.   BUN 15 6 - 20 mg/dL   Creatinine, Ser 0.92 0.61 - 1.24 mg/dL   Calcium 8.7 (L) 8.9 - 10.3 mg/dL   GFR, Estimated >60 >60 mL/min    Comment: (NOTE) Calculated using the CKD-EPI Creatinine Equation (2021)    Anion gap 12 5 - 15    Comment: Performed at [place] [address], [place], [place] [address]   No results found.  Review of Systems  All other systems reviewed and are negative.   Blood pressure 119/73, pulse (!) 51, temperature 98.2 F (36.8 C), temperature source Oral, resp. rate 18, height '6\' 1"'$  (1.854 m), weight 85.3 kg, SpO2 100 %. Physical Exam Constitutional:      Appearance: Normal appearance. He is normal weight.  HENT:     Head: Normocephalic and atraumatic.     Right Ear: External ear normal.     Left Ear: External ear normal.     Nose: Nose normal.     Mouth/Throat:     Mouth: Mucous membranes are moist.     Pharynx: Oropharynx is clear.     Comments: Mildly hoarse. Eyes:     Extraocular Movements: Extraocular movements intact.     Conjunctiva/sclera: Conjunctivae normal.     Pupils: Pupils are equal, round, and reactive to light.  Cardiovascular:     Rate and Rhythm: Normal rate.  Pulmonary:     Effort: Pulmonary effort is normal.  Musculoskeletal:     Cervical back: Normal range of motion.  Skin:    General: Skin is warm and dry.  Neurological:     General: No focal deficit present.     Mental Status: He is alert and oriented to person, place, and time.  Psychiatric:        Mood and Affect:  Mood normal.        Behavior: Behavior normal.        Thought Content: Thought content normal.        Judgment: Judgment normal.      Assessment/Plan Hoarseness, vocal fold papilloma  To OR for SMDL and CO2 laser removal of papilloma.  Melida Quitter, MD 06/22/2022, 11:00 AM

## 2022-06-22 NOTE — Brief Op Note (Signed)
06/22/2022  12:02 PM  PATIENT:  Mathew Phillips  62 y.o. male  PRE-OPERATIVE DIAGNOSIS:  Laryngeal papilloma Dysphonia  POST-OPERATIVE DIAGNOSIS:  Laryngeal papilloma Dysphonia  PROCEDURE:  Procedure(s): MICROLARYNGOSCOPY WITH CO2 LASER AND EXCISION OF VOCAL CORD LESION (Bilateral)  SURGEON:  Surgeon(s) and Role:    Melida Quitter, MD - Primary  PHYSICIAN ASSISTANT:   ASSISTANTS: none   ANESTHESIA:   general  EBL:  Minimal   BLOOD ADMINISTERED:none  DRAINS: none   LOCAL MEDICATIONS USED:  NONE  SPECIMEN:  Source of Specimen:  right vocal fold lesion  DISPOSITION OF SPECIMEN:  PATHOLOGY  COUNTS:  YES  TOURNIQUET:  * No tourniquets in log *  DICTATION: .Note written in EPIC  PLAN OF CARE: Discharge to home after PACU  PATIENT DISPOSITION:  PACU - hemodynamically stable.   Delay start of Pharmacological VTE agent (>24hrs) due to surgical blood loss or risk of bleeding: no

## 2022-06-23 ENCOUNTER — Encounter (HOSPITAL_COMMUNITY): Payer: Self-pay | Admitting: Otolaryngology

## 2022-06-23 LAB — SURGICAL PATHOLOGY

## 2022-06-24 ENCOUNTER — Ambulatory Visit: Payer: BC Managed Care – PPO | Admitting: General Practice

## 2022-06-29 DIAGNOSIS — N401 Enlarged prostate with lower urinary tract symptoms: Secondary | ICD-10-CM | POA: Diagnosis not present

## 2022-06-29 DIAGNOSIS — R3912 Poor urinary stream: Secondary | ICD-10-CM | POA: Diagnosis not present

## 2022-06-29 DIAGNOSIS — R350 Frequency of micturition: Secondary | ICD-10-CM | POA: Diagnosis not present

## 2022-07-07 DIAGNOSIS — G4733 Obstructive sleep apnea (adult) (pediatric): Secondary | ICD-10-CM | POA: Diagnosis not present

## 2022-07-07 DIAGNOSIS — D141 Benign neoplasm of larynx: Secondary | ICD-10-CM | POA: Diagnosis not present

## 2022-07-07 DIAGNOSIS — Z6825 Body mass index (BMI) 25.0-25.9, adult: Secondary | ICD-10-CM | POA: Diagnosis not present

## 2022-07-14 NOTE — Progress Notes (Signed)
Cardiology Clinic Note   Patient Name: Mathew Phillips Date of Encounter: 07/19/2022  Primary Care Provider:  Dulce Sellar, MD Primary Cardiologist:  Shelva Majestic, MD  Patient Profile    Mathew Phillips is a 62 y.o. male with a past medical history of atrial tachycardia, A-fib s/p A-fib ablation, OSA on CPAP, lower extremity varicose veins s/p ablation who presents to the clinic today for 1 year follow-up of chronic cardiac conditions.  Past Medical History    Past Medical History:  Diagnosis Date   Atrial fibrillation ([place])    Multiple thyroid nodules    benign   Sleep apnea    Varicose veins of left lower extremity    Past Surgical History:  Procedure Laterality Date   ATRIAL FIBRILLATION ABLATION     ENDOVENOUS ABLATION SAPHENOUS VEIN W/ LASER Left 08/09/2018   endovenous laser ablation left greater saphenous vein and stab phlebectomy >20 incisions left leg by Deitra Mayo MD   KNEE SURGERY  both   MICROLARYNGOSCOPY WITH CO2 LASER AND EXCISION OF VOCAL CORD LESION Bilateral 06/22/2022   Procedure: MICROLARYNGOSCOPY WITH CO2 LASER AND EXCISION OF VOCAL CORD LESION;  Surgeon: Melida Quitter, MD;  Location: [facility];  Service: ENT;  Laterality: Bilateral;   SHOULDER SURGERY  both   WRIST SURGERY Left     Allergies  Allergies  Allergen Reactions   Yellow Jacket Venom [Bee Venom] Anaphylaxis, Hives and Swelling   Codeine Hives and Rash    History of Present Illness    Mathew Phillips has a past medical history of: Palpitations/atrial tachycardia/PAF. Afib ablation in 2013. Echo 06/22/2020: EF 60 to 65%.  Moderately enlarged right ventricle.  Mild LAE.  Moderate RAE.  Trivial AR. OSA. Lower extremity varicose veins.  Mathew Phillips was first evaluated by Dr. Claiborne Billings on 07/03/2017 to establish cardiology care after moving into the area from [place], [place].  Patient reported history of wide-complex tachycardia picked up on outpatient monitoring in 2013 that  was presumed to be VT.  He underwent cardiac catheterization, echo and EPS along with cardiac MRI.  EPS did not reveal inducible arrhythmia and he underwent successful cardioversion to sinus rhythm.  Cardiac MRI did not reveal infiltrative disease and no evidence of RV dysplasia.  He underwent ablation for A-fib without recurrence. He was last seen in the office by Coletta Memos, NP on 06/03/2021 for follow-up of palpitations. At that time he was training for the [facility] half marathon and had lost 50 lb. No medication changes were made.   Today, patient is doing very well. He denies shortness of breath or dyspnea on exertion. No chest pain, pressure, or tightness. Denies lower extremity edema, orthopnea, or PND. No palpitations. He continues to run for exercise. He completed the [facility] half marathon and several shorter (5k, 10k) races. He has plans for another half marathon mid February. He would like to train a little harder to improve his race times. He monitors his heart rate/rhythm with a [employer] smart watch.   Home Medications    Current Meds  Medication Sig   aspirin EC 81 MG tablet Take 81 mg by mouth daily. Swallow whole.   metoprolol succinate (TOPROL XL) 25 MG 24 hr tablet Take 1 tablet (25 mg total) by mouth daily.   Multiple Vitamin (MULTIVITAMIN WITH MINERALS) TABS tablet Take 1 tablet by mouth daily.   tamsulosin (FLOMAX) 0.4 MG CAPS capsule Take 0.8 mg by mouth daily.    Family History  Family History  Problem Relation Age of Onset   COPD Mother    Coronary artery disease Mother    Cancer Father    He indicated that his mother is deceased. He indicated that his father is deceased.   Social History    Social History   Socioeconomic History   Marital status: Married    Spouse name: Not on file   Number of children: Not on file   Years of education: Not on file   Highest education level: Not on file  Occupational History   Not on file  Tobacco Use   Smoking  status: Never   Smokeless tobacco: Never  Vaping Use   Vaping Use: Never used  Substance and Sexual Activity   Alcohol use: Yes    Comment: rarely   Drug use: No   Sexual activity: Not on file  Other Topics Concern   Not on file  Social History Narrative   Not on file   Social Determinants of Health   Financial Resource Strain: Not on file  Food Insecurity: Not on file  Transportation Needs: Not on file  Physical Activity: Not on file  Stress: Not on file  Social Connections: Not on file  Intimate Partner Violence: Not on file     Review of Systems    General: No chills, fever, night sweats or weight changes.  Cardiovascular:  No chest pain, dyspnea on exertion, edema, orthopnea, palpitations, paroxysmal nocturnal dyspnea. Dermatological: No rash, lesions/masses Respiratory: No cough, dyspnea Urologic: No hematuria, dysuria Abdominal:   No nausea, vomiting, diarrhea, bright red blood per rectum, melena, or hematemesis Neurologic:  No visual changes, weakness, changes in mental status. All other systems reviewed and are otherwise negative except as noted above.  Physical Exam    VS:  BP 118/72 (BP Location: Left Arm, Patient Position: Sitting, Cuff Size: Normal)   Pulse (!) 58   Ht '6\' 1"'$  (1.854 m)   Wt 192 lb 3.2 oz (87.2 kg)   SpO2 97%   BMI 25.36 kg/m  , BMI Body mass index is 25.36 kg/m. GEN:  Well nourished, well developed, in no acute distress. HEENT: Normal. Neck: Supple, no JVD, carotid bruits, or masses. Cardiac: RRR, no murmurs, rubs, or gallops. No clubbing, cyanosis, edema.  Radials/DP/PT 2+ and equal bilaterally.  Respiratory:  Respirations regular and unlabored, clear to auscultation bilaterally. GI: Soft, nontender, nondistended. MS: No deformity or atrophy. Skin: Warm and dry, no rash. Neuro: Strength and sensation are intact. Psych: Normal affect.  Accessory Clinical Findings    Recent Labs: 06/22/2022: BUN 15; Creatinine, Ser 0.92; Hemoglobin  13.1; Platelets 236; Potassium 3.8; Sodium 140   Recent Lipid Panel    Component Value Date/Time   CHOL 146 07/04/2017 0826   TRIG 51 07/04/2017 0826   HDL 40 07/04/2017 0826   CHOLHDL 3.7 07/04/2017 0826   LDLCALC 96 07/04/2017 0826        ECG is not indicated today.    Assessment & Plan   Palpitations/atrial tachycardia/PAF. S/p afib ablation 2013. Patient denies palpitations. He monitors is heart rate/rhythm with a Samsung smart watch and has not noted any arrhythmias. He plans on training a little harder to improve his race times. Continue Metoprolol.       Disposition: Return in 1 year or sooner as needed.    Justice Britain. Shaunita Seney, DNP, NP-C     07/19/2022, 9:23 AM [place] [address] Office [phone] Fax [phone]

## 2022-07-19 ENCOUNTER — Encounter: Payer: Self-pay | Admitting: General Practice

## 2022-07-19 ENCOUNTER — Ambulatory Visit: Payer: BC Managed Care – PPO | Attending: General Practice | Admitting: Student

## 2022-07-19 VITALS — BP 118/72 | HR 58 | Ht 73.0 in | Wt 192.2 lb

## 2022-07-19 DIAGNOSIS — I480 Paroxysmal atrial fibrillation: Secondary | ICD-10-CM

## 2022-07-19 DIAGNOSIS — R002 Palpitations: Secondary | ICD-10-CM | POA: Diagnosis not present

## 2022-07-19 NOTE — Patient Instructions (Signed)
Medication Instructions:  The current medical regimen is effective;  continue present plan and medications as directed. Please refer to the Current Medication list given to you today.  *If you need a refill on your cardiac medications before your next appointment, please call your pharmacy*   Lab Work: NONE If you have labs (blood work) drawn today and your tests are completely normal, you will receive your results only by: [place] (if you have MyChart) OR A paper copy in the mail If you have any lab test that is abnormal or we need to change your treatment, we will call you to review the results.  Testing/Procedures: NONE  Follow-Up: At [facility], you and your health needs are our priority.  As part of our continuing mission to provide you with exceptional heart care, we have created designated Provider Care Teams.  These Care Teams include your primary Cardiologist (physician) and Advanced Practice Providers (APPs -  Physician Assistants and Nurse Practitioners) who all work together to provide you with the care you need, when you need it.  Your next appointment:   12 month(s)  Provider:   Shelva Majestic, MD     Other Instructions

## 2022-08-12 DIAGNOSIS — R3912 Poor urinary stream: Secondary | ICD-10-CM | POA: Diagnosis not present

## 2022-08-12 DIAGNOSIS — N401 Enlarged prostate with lower urinary tract symptoms: Secondary | ICD-10-CM | POA: Diagnosis not present

## 2022-08-12 DIAGNOSIS — R350 Frequency of micturition: Secondary | ICD-10-CM | POA: Diagnosis not present

## 2022-08-16 ENCOUNTER — Other Ambulatory Visit: Payer: Self-pay | Admitting: Urology

## 2022-09-06 DIAGNOSIS — R3912 Poor urinary stream: Secondary | ICD-10-CM | POA: Diagnosis not present

## 2022-09-06 DIAGNOSIS — R350 Frequency of micturition: Secondary | ICD-10-CM | POA: Diagnosis not present

## 2022-09-06 DIAGNOSIS — R8271 Bacteriuria: Secondary | ICD-10-CM | POA: Diagnosis not present

## 2022-09-06 DIAGNOSIS — N401 Enlarged prostate with lower urinary tract symptoms: Secondary | ICD-10-CM | POA: Diagnosis not present

## 2022-09-12 ENCOUNTER — Encounter (HOSPITAL_BASED_OUTPATIENT_CLINIC_OR_DEPARTMENT_OTHER): Payer: Self-pay | Admitting: Urology

## 2022-09-12 NOTE — Progress Notes (Addendum)
Spoke w/ via phone for pre-op interview--- pt Lab needs dos----  [employer] results------ current EKG in epic/ chart COVID test -----patient states asymptomatic no test needed Arrive at ------- 1045 on 09-13-2022 NPO after MN NO Solid Food.  Clear liquids from MN until--- 0945 Med rec completed Medications to take morning of surgery ----- toprol, flomax Diabetic medication ----- n/a Patient instructed no nail polish to be worn day of surgery Patient instructed to bring photo id and insurance card day of surgery Patient aware to have Driver (ride ) / caregiver    for 24 hours after surgery -- wife, anne Patient Special Instructions ----- reviewed RCC and visitor guidelines.  Asked pt to bring cpap/ mask/ tubing dos. Pre-Op special Istructions ----- n/a Patient verbalized understanding of instructions that were given at this phone interview. Patient denies shortness of breath, chest pain, fever, cough at this phone interview.   Anesthesia Review: AT/ PAF  s/p  ablation 2013 in [facility];  OSA per pt uses nightly; multiple thyroid nodules, negative bx 06/ 2023;  s/p excision laryngeal papilloma 06-29-2022  PCP: Dr Netta Cedars Cardiologist : Dr Corky Downs ([id number]) Chest x-ray : no EKG : 06-22-2022 Echo : 06-22-2022 Stress test: no Cardiac Cath :  per cardiology note one done in 2013 prior to ablation in [facility] Activity level: denies sob w/ any acitivity Sleep Study/ CPAP : yes/ yes Blood Thinner/ Instructions /Last Dose: no ASA / Instructions/ Last Dose : ASA 81mg ;  per pt was given instructions from Dr Claudia Desanctis office to stop one week prior to surgery, stated last dose 08-07-2022

## 2022-09-13 ENCOUNTER — Encounter (HOSPITAL_BASED_OUTPATIENT_CLINIC_OR_DEPARTMENT_OTHER)
Admission: RE | Disposition: A | Discharge status: Home / Self Care | Payer: Self-pay | Source: Home / Self Care | Attending: Urology

## 2022-09-13 ENCOUNTER — Other Ambulatory Visit: Payer: Self-pay

## 2022-09-13 ENCOUNTER — Ambulatory Visit (HOSPITAL_BASED_OUTPATIENT_CLINIC_OR_DEPARTMENT_OTHER): Payer: BC Managed Care – PPO | Admitting: Anesthesiology

## 2022-09-13 ENCOUNTER — Encounter (HOSPITAL_BASED_OUTPATIENT_CLINIC_OR_DEPARTMENT_OTHER): Payer: Self-pay | Admitting: Urology

## 2022-09-13 ENCOUNTER — Ambulatory Visit (HOSPITAL_BASED_OUTPATIENT_CLINIC_OR_DEPARTMENT_OTHER)
Admission: RE | Admit: 2022-09-13 | Discharge: 2022-09-14 | Disposition: A | Discharge status: Home / Self Care | Payer: BC Managed Care – PPO | Attending: Urology | Admitting: Urology

## 2022-09-13 DIAGNOSIS — I10 Essential (primary) hypertension: Secondary | ICD-10-CM | POA: Insufficient documentation

## 2022-09-13 DIAGNOSIS — Z79899 Other long term (current) drug therapy: Secondary | ICD-10-CM | POA: Diagnosis not present

## 2022-09-13 DIAGNOSIS — N138 Other obstructive and reflux uropathy: Secondary | ICD-10-CM | POA: Insufficient documentation

## 2022-09-13 DIAGNOSIS — G4730 Sleep apnea, unspecified: Secondary | ICD-10-CM | POA: Insufficient documentation

## 2022-09-13 DIAGNOSIS — N401 Enlarged prostate with lower urinary tract symptoms: Secondary | ICD-10-CM | POA: Diagnosis not present

## 2022-09-13 DIAGNOSIS — N320 Bladder-neck obstruction: Secondary | ICD-10-CM | POA: Insufficient documentation

## 2022-09-13 DIAGNOSIS — I480 Paroxysmal atrial fibrillation: Secondary | ICD-10-CM | POA: Diagnosis not present

## 2022-09-13 DIAGNOSIS — N400 Enlarged prostate without lower urinary tract symptoms: Secondary | ICD-10-CM | POA: Diagnosis not present

## 2022-09-13 DIAGNOSIS — Z01818 Encounter for other preprocedural examination: Secondary | ICD-10-CM

## 2022-09-13 HISTORY — DX: Obstructive sleep apnea (adult) (pediatric): G47.33

## 2022-09-13 HISTORY — DX: Other supraventricular tachycardia: I47.19

## 2022-09-13 HISTORY — PX: TRANSURETHRAL RESECTION OF PROSTATE: SHX73

## 2022-09-13 HISTORY — DX: Retention of urine, unspecified: R33.9

## 2022-09-13 HISTORY — DX: Bladder-neck obstruction: N32.0

## 2022-09-13 HISTORY — DX: Palpitations: R00.2

## 2022-09-13 HISTORY — DX: Benign prostatic hyperplasia without lower urinary tract symptoms: N40.0

## 2022-09-13 LAB — POCT I-STAT, CHEM 8
BUN: 13 mg/dL (ref 6–20)
Calcium, Ion: 1.27 mmol/L (ref 1.15–1.40)
Chloride: 104 mmol/L (ref 98–111)
Creatinine, Ser: 0.8 mg/dL (ref 0.61–1.24)
Glucose, Bld: 87 mg/dL (ref 70–99)
HCT: 45 % (ref 39.0–52.0)
Hemoglobin: 15.3 g/dL (ref 13.0–17.0)
Potassium: 3.7 mmol/L (ref 3.5–5.1)
Sodium: 144 mmol/L (ref 135–145)
TCO2: 26 mmol/L (ref 22–32)

## 2022-09-13 SURGERY — TURP (TRANSURETHRAL RESECTION OF PROSTATE)
Anesthesia: General | Site: Prostate

## 2022-09-13 MED ORDER — SODIUM CHLORIDE 0.9% FLUSH: 3.0000 mL | INTRAVENOUS | Status: DC | PRN

## 2022-09-13 MED ORDER — LIDOCAINE 2% (20 MG/ML) 5 ML SYRINGE
INTRAMUSCULAR | Status: DC | PRN
  Administered 2022-09-13: 60 mg via INTRAVENOUS

## 2022-09-13 MED ORDER — ONDANSETRON HCL 4 MG/2ML IJ SOLN
INTRAMUSCULAR | Status: AC
  Filled 2022-09-13: qty 2

## 2022-09-13 MED ORDER — MIDAZOLAM HCL 2 MG/2ML IJ SOLN
INTRAMUSCULAR | Status: DC | PRN
  Administered 2022-09-13: 2 mg via INTRAVENOUS

## 2022-09-13 MED ORDER — TAMSULOSIN HCL 0.4 MG PO CAPS: 0.8000 mg | ORAL_CAPSULE | Freq: Every day | ORAL | Status: DC

## 2022-09-13 MED ORDER — OXYCODONE HCL 5 MG PO TABS
ORAL_TABLET | ORAL | Status: AC
  Filled 2022-09-13: qty 1

## 2022-09-13 MED ORDER — DEXAMETHASONE SODIUM PHOSPHATE 10 MG/ML IJ SOLN
INTRAMUSCULAR | Status: DC | PRN
  Administered 2022-09-13: 5 mg via INTRAVENOUS

## 2022-09-13 MED ORDER — CEFAZOLIN SODIUM-DEXTROSE 2-4 GM/100ML-% IV SOLN
INTRAVENOUS | Status: AC
  Filled 2022-09-13: qty 100

## 2022-09-13 MED ORDER — SODIUM CHLORIDE 0.9 % IR SOLN
3000.0000 mL | Status: DC
  Administered 2022-09-14: 3000 mL

## 2022-09-13 MED ORDER — SODIUM CHLORIDE 0.9% FLUSH: 3.0000 mL | Freq: Two times a day (BID) | INTRAVENOUS | Status: DC

## 2022-09-13 MED ORDER — FENTANYL CITRATE (PF) 100 MCG/2ML IJ SOLN
INTRAMUSCULAR | Status: DC | PRN
  Administered 2022-09-13 (×2): 25 ug via INTRAVENOUS
  Administered 2022-09-13: 50 ug via INTRAVENOUS

## 2022-09-13 MED ORDER — HYOSCYAMINE SULFATE 0.125 MG SL SUBL: 0.1250 mg | SUBLINGUAL_TABLET | SUBLINGUAL | Status: DC | PRN

## 2022-09-13 MED ORDER — HYDROMORPHONE HCL 1 MG/ML IJ SOLN
INTRAMUSCULAR | Status: AC
  Filled 2022-09-13: qty 1

## 2022-09-13 MED ORDER — ONDANSETRON HCL 4 MG/2ML IJ SOLN
INTRAMUSCULAR | Status: DC | PRN
  Administered 2022-09-13: 4 mg via INTRAVENOUS

## 2022-09-13 MED ORDER — MIDAZOLAM HCL 2 MG/2ML IJ SOLN
INTRAMUSCULAR | Status: AC
  Filled 2022-09-13: qty 2

## 2022-09-13 MED ORDER — ADULT MULTIVITAMIN W/MINERALS CH
1.0000 | ORAL_TABLET | Freq: Every day | ORAL | Status: DC
  Administered 2022-09-13: 1 via ORAL
  Filled 2022-09-13: qty 1

## 2022-09-13 MED ORDER — LACTATED RINGERS IV SOLN: INTRAVENOUS | Status: DC

## 2022-09-13 MED ORDER — MEPERIDINE HCL 25 MG/ML IJ SOLN: 6.2500 mg | INTRAMUSCULAR | Status: DC | PRN

## 2022-09-13 MED ORDER — LIDOCAINE HCL (PF) 2 % IJ SOLN
INTRAMUSCULAR | Status: AC
  Filled 2022-09-13: qty 5

## 2022-09-13 MED ORDER — SODIUM CHLORIDE 0.9 % IV SOLN: INTRAVENOUS | Status: DC

## 2022-09-13 MED ORDER — SENNOSIDES-DOCUSATE SODIUM 8.6-50 MG PO TABS
2.0000 | ORAL_TABLET | Freq: Every day | ORAL | Status: DC
  Administered 2022-09-13: 1 via ORAL
  Filled 2022-09-13: qty 2

## 2022-09-13 MED ORDER — OXYCODONE HCL 5 MG/5ML PO SOLN: 5.0000 mg | Freq: Once | ORAL | Status: DC | PRN

## 2022-09-13 MED ORDER — PROPOFOL 10 MG/ML IV BOLUS
INTRAVENOUS | Status: DC | PRN
  Administered 2022-09-13: 200 mg via INTRAVENOUS

## 2022-09-13 MED ORDER — ACETAMINOPHEN 325 MG PO TABS
650.0000 mg | ORAL_TABLET | ORAL | Status: DC | PRN
  Administered 2022-09-13 – 2022-09-14 (×3): 650 mg via ORAL

## 2022-09-13 MED ORDER — HYDROMORPHONE HCL 1 MG/ML IJ SOLN
0.2500 mg | INTRAMUSCULAR | Status: DC | PRN
  Administered 2022-09-13: 0.25 mg via INTRAVENOUS

## 2022-09-13 MED ORDER — DEXAMETHASONE SODIUM PHOSPHATE 10 MG/ML IJ SOLN
INTRAMUSCULAR | Status: AC
  Filled 2022-09-13: qty 1

## 2022-09-13 MED ORDER — DIPHENHYDRAMINE HCL 12.5 MG/5ML PO ELIX: 12.5000 mg | ORAL_SOLUTION | Freq: Four times a day (QID) | ORAL | Status: DC | PRN

## 2022-09-13 MED ORDER — BACITRACIN-NEOMYCIN-POLYMYXIN OINTMENT TUBE
TOPICAL_OINTMENT | CUTANEOUS | Status: AC
  Filled 2022-09-13: qty 14.17

## 2022-09-13 MED ORDER — PROMETHAZINE HCL 25 MG/ML IJ SOLN: 6.2500 mg | INTRAMUSCULAR | Status: DC | PRN

## 2022-09-13 MED ORDER — 0.9 % SODIUM CHLORIDE (POUR BTL) OPTIME
TOPICAL | Status: DC | PRN
  Administered 2022-09-13: 500 mL

## 2022-09-13 MED ORDER — PROPOFOL 10 MG/ML IV BOLUS
INTRAVENOUS | Status: AC
  Filled 2022-09-13: qty 20

## 2022-09-13 MED ORDER — METOPROLOL SUCCINATE ER 25 MG PO TB24
25.0000 mg | ORAL_TABLET | Freq: Every day | ORAL | Status: DC
  Filled 2022-09-13: qty 1

## 2022-09-13 MED ORDER — ACETAMINOPHEN 325 MG PO TABS
ORAL_TABLET | ORAL | Status: AC
  Filled 2022-09-13: qty 2

## 2022-09-13 MED ORDER — MORPHINE SULFATE (PF) 4 MG/ML IV SOLN: 2.0000 mg | INTRAVENOUS | Status: DC | PRN

## 2022-09-13 MED ORDER — FENTANYL CITRATE (PF) 100 MCG/2ML IJ SOLN
INTRAMUSCULAR | Status: AC
  Filled 2022-09-13: qty 2

## 2022-09-13 MED ORDER — SODIUM CHLORIDE 0.9 % IR SOLN
Status: DC | PRN
  Administered 2022-09-13 (×2): 6000 mL

## 2022-09-13 MED ORDER — AMISULPRIDE (ANTIEMETIC) 5 MG/2ML IV SOLN: 10.0000 mg | Freq: Once | INTRAVENOUS | Status: DC | PRN

## 2022-09-13 MED ORDER — DIPHENHYDRAMINE HCL 50 MG/ML IJ SOLN
INTRAMUSCULAR | Status: AC
  Filled 2022-09-13: qty 1

## 2022-09-13 MED ORDER — DIPHENHYDRAMINE HCL 50 MG/ML IJ SOLN
12.5000 mg | Freq: Four times a day (QID) | INTRAMUSCULAR | Status: DC | PRN
  Administered 2022-09-13 – 2022-09-14 (×2): 25 mg via INTRAVENOUS

## 2022-09-13 MED ORDER — ONDANSETRON HCL 4 MG/2ML IJ SOLN: 4.0000 mg | INTRAMUSCULAR | Status: DC | PRN

## 2022-09-13 MED ORDER — SODIUM CHLORIDE 0.9 % IV SOLN: 250.0000 mL | INTRAVENOUS | Status: DC | PRN

## 2022-09-13 MED ORDER — OXYCODONE HCL 5 MG PO TABS
5.0000 mg | ORAL_TABLET | ORAL | Status: DC | PRN
  Administered 2022-09-13 – 2022-09-14 (×2): 5 mg via ORAL

## 2022-09-13 MED ORDER — CEFAZOLIN SODIUM-DEXTROSE 2-4 GM/100ML-% IV SOLN
2.0000 g | Freq: Three times a day (TID) | INTRAVENOUS | Status: AC
  Administered 2022-09-13 – 2022-09-14 (×2): 2 g via INTRAVENOUS

## 2022-09-13 MED ORDER — TRIPLE ANTIBIOTIC 3.5-400-5000 EX OINT
1.0000 | TOPICAL_OINTMENT | Freq: Three times a day (TID) | CUTANEOUS | Status: DC | PRN
  Administered 2022-09-13: 1 via TOPICAL

## 2022-09-13 MED ORDER — OXYCODONE HCL 5 MG PO TABS: 5.0000 mg | ORAL_TABLET | Freq: Once | ORAL | Status: DC | PRN

## 2022-09-13 MED ORDER — CEFAZOLIN SODIUM-DEXTROSE 2-4 GM/100ML-% IV SOLN
2.0000 g | INTRAVENOUS | Status: AC
  Administered 2022-09-13: 2 g via INTRAVENOUS

## 2022-09-13 SURGICAL SUPPLY — 20 items
BAG DRAIN URO-CYSTO SKYTR STRL (DRAIN) ×1 IMPLANT
BAG DRN RND TRDRP ANRFLXCHMBR (UROLOGICAL SUPPLIES) ×1
BAG DRN UROCATH (DRAIN) ×1
BAG URINE DRAIN 2000ML AR STRL (UROLOGICAL SUPPLIES) ×1 IMPLANT
CATH FOLEY 3WAY 30CC 22FR (CATHETERS) ×1 IMPLANT
CATH HEMA 3WAY 30CC 22FR COUDE (CATHETERS) ×1 IMPLANT
CLOTH BEACON ORANGE TIMEOUT ST (SAFETY) ×1 IMPLANT
GLOVE BIO SURGEON STRL SZ 6.5 (GLOVE) ×1 IMPLANT
GOWN STRL REUS W/TWL LRG LVL3 (GOWN DISPOSABLE) ×1 IMPLANT
HOLDER FOLEY CATH W/STRAP (MISCELLANEOUS) ×1 IMPLANT
IV NS IRRIG 3000ML ARTHROMATIC (IV SOLUTION) ×2 IMPLANT
KIT TURNOVER CYSTO (KITS) ×1 IMPLANT
LOOP CUT BIPOLAR 24F LRG (ELECTROSURGICAL) IMPLANT
MANIFOLD NEPTUNE II (INSTRUMENTS) ×1 IMPLANT
PACK CYSTO (CUSTOM PROCEDURE TRAY) ×1 IMPLANT
SLEEVE SCD COMPRESS KNEE MED (STOCKING) ×1 IMPLANT
SYR TOOMEY IRRIG 70ML (MISCELLANEOUS) ×1
SYRINGE TOOMEY IRRIG 70ML (MISCELLANEOUS) ×1 IMPLANT
TUBE CONNECTING 12X1/4 (SUCTIONS) ×1 IMPLANT
TUBING UROLOGY SET (TUBING) ×1 IMPLANT

## 2022-09-13 NOTE — Discharge Instructions (Signed)
Transurethral Resection of the Prostate (TURP)   Care After  Refer to this sheet in the next few weeks. These discharge instructions provide you with general information on caring for yourself after you leave the hospital. Your caregiver may also give you specific instructions. Your treatment has been planned according to the most current medical practices available, but unavoidable complications sometimes occur. If you have any problems or questions after discharge, please call your caregiver.  HOME CARE INSTRUCTIONS   Medications You may receive medicine for pain management. As your level of discomfort decreases, adjustments in your pain medicines may be made.  Take all medicines as directed.  You may be given a medicine (antibiotic) to kill germs following surgery. Finish all medicines. Let your caregiver know if you have any side effects or problems from the medicine.  If you are on aspirin, it would be best not to restart the aspirin until the blood in the urine clears Tylenol (acetaminophen) - 3000mg  per 24 hour period max Ibuprofen - 2400mg  per 24 hour period (600mg  x 4/day or 800mg  x 3/day) AZO over the counter to help with burning with urination  Hygiene You can take a shower after surgery.  You should not take a bath while you still have the urethral catheter. Activity You will be encouraged to get out of bed as much as possible and increase your activity level as tolerated.  Spend the first week in and around your home. For 3 weeks, avoid the following:  Straining.  Running.  Strenuous work.  Walks longer than a few blocks.  Riding for extended periods.  Sexual relations.  Do not lift heavy objects (more than 20 pounds) for at least 1 month. When lifting, use your arms instead of your abdominal muscles.  You will be encouraged to walk as tolerated. Do not exert yourself. Increase your activity level slowly. Remember that it is important to keep moving after an operation of  any type. This cuts down on the possibility of developing blood clots.  Your caregiver will tell you when you can resume driving and light housework. Discuss this at your first office visit after discharge. Diet No special diet is ordered after a TURP. However, if you are on a special diet for another medical problem, it should be continued.  Normal fluid intake is usually recommended.  Avoid alcohol and caffeinated drinks for 2 weeks. They irritate the bladder. Decaffeinated drinks are okay.  Avoid spicy foods.  Bladder Function For the first 10 days, empty the bladder whenever you feel a definite desire. Do not try to hold the urine for long periods of time.  Urinating once or twice a night even after you are healed is not uncommon.  You may see some recurrence of blood in the urine after discharge from the hospital. This usually happens within 2 weeks after the procedure.If this occurs, force fluids again as you did in the hospital and reduce your activity.  Bowel Function You may experience some constipation after surgery. This can be minimized by increasing fluids and fiber in your diet. Drink enough water and fluids to keep your urine clear or pale yellow.  A stool softener may be prescribed for use at home. Do not strain to move your bowels.  If you are requiring increased pain medicine, it is important that you take stool softeners to prevent constipation. This will help to promote proper healing by reducing the need to strain to move your bowels.  Sexual Activity Semen movement  in the opposite direction and into the bladder (retrograde ejaculation) may occur. Since the semen passes into the bladder, cloudy urine can occur the first time you urinate after intercourse. Or, you may not have an ejaculation during erection. Ask your caregiver when you can resume sexual activity. Retrograde ejaculation and reduced semen discharge should not reduce one's pleasure of intercourse.  Postoperative  Visit Arrange the date and time of your after surgery visit with your caregiver.  Return to Work After your recovery is complete, you will be able to return to work and resume all activities. Your caregiver will inform you when you can return to work.    Foley Catheter Care A soft, flexible tube (Foley catheter) may have been placed in your bladder to drain urine and fluid. Follow these instructions: Taking Care of the Catheter Keep the area where the catheter leaves your body clean.  Attach the catheter to the leg so there is no tension on the catheter.  Keep the drainage bag below the level of the bladder, but keep it OFF the floor.  Do not take long soaking baths. Your caregiver will give instructions about showering.  Wash your hands before touching ANYTHING related to the catheter or bag.  Using mild soap and warm water on a washcloth:  Clean the area closest to the catheter insertion site using a circular motion around the catheter.  Clean the catheter itself by wiping AWAY from the insertion site for several inches down the tube.  NEVER wipe upward as this could sweep bacteria up into the urethra (tube in your body that normally drains the bladder) and cause infection.  Place a small amount of sterile lubricant at the tip of the penis where the catheter is entering.  Taking Care of the Drainage Bags Two drainage bags may be taken home: a large overnight drainage bag, and a smaller leg bag which fits underneath clothing.  It is okay to wear the overnight bag at any time, but NEVER wear the smaller leg bag at night.  Keep the drainage bag well below the level of your bladder. This prevents backflow of urine into the bladder and allows the urine to drain freely.  Anchor the tubing to your leg to prevent pulling or tension on the catheter. Use tape or a leg strap provided by the hospital.  Empty the drainage bag when it is 1/2 to 3/4 full. Wash your hands before and after touching the bag.   Periodically check the tubing for kinks to make sure there is no pressure on the tubing which could restrict the flow of urine.  Changing the Drainage Bags Cleanse both ends of the clean bag with alcohol before changing.  Pinch off the rubber catheter to avoid urine spillage during the disconnection.  Disconnect the dirty bag and connect the clean one.  Empty the dirty bag carefully to avoid a urine spill.  Attach the new bag to the leg with tape or a leg strap.  Cleaning the Drainage Bags Whenever a drainage bag is disconnected, it must be cleaned quickly so it is ready for the next use.  Wash the bag in warm, soapy water.  Rinse the bag thoroughly with warm water.  Soak the bag for 30 minutes in a solution of white vinegar and water (1 cup vinegar to 1 quart warm water).  Rinse with warm water.  SEEK MEDICAL CARE IF:  You have chills or night sweats.  You are leaking around your catheter or have  problems with your catheter. It is not uncommon to have sporadic leakage around your catheter as a result of bladder spasms. If the leakage stops, there is not much need for concern. If you are uncertain, call your caregiver.  You develop side effects that you think are coming from your medicines.  SEEK IMMEDIATE MEDICAL CARE IF:  You are suddenly unable to urinate. Check to see if there are any kinks in the drainage tubing that may cause this. If you cannot find any kinks, call your caregiver immediately. This is an emergency.  You develop shortness of breath or chest pains.  Bleeding persists or clots develop in your urine.  You have a fever.  You develop pain in your back or over your lower belly (abdomen).  You develop pain or swelling in your legs.  Any problems you are having get worse rather than better.  MAKE SURE YOU:  Understand these instructions.  Will watch your condition.  Will get help right away if you are not doing well or get worse.

## 2022-09-13 NOTE — Anesthesia Procedure Notes (Signed)
Procedure Name: LMA Insertion Date/Time: 09/13/2022 12:31 PM  Performed by: Suan Halter, CRNAPre-anesthesia Checklist: Patient identified, Emergency Drugs available, Suction available and Patient being monitored Patient Re-evaluated:Patient Re-evaluated prior to induction Oxygen Delivery Method: Circle system utilized Preoxygenation: Pre-oxygenation with 100% oxygen Induction Type: IV induction Ventilation: Mask ventilation without difficulty LMA: LMA inserted LMA Size: 4.0 Number of attempts: 1 Airway Equipment and Method: Bite block Placement Confirmation: positive ETCO2 Tube secured with: Tape Dental Injury: Teeth and Oropharynx as per pre-operative assessment

## 2022-09-13 NOTE — Interval H&P Note (Signed)
History and Physical Interval Note:  09/13/2022 11:59 AM  Mathew Phillips  has presented today for surgery, with the diagnosis of BENIGN PROSTATIC HYPERPLASIA.  The various methods of treatment have been discussed with the patient and family. After consideration of risks, benefits and other options for treatment, the patient has consented to  Procedure(s) with comments: Mulat (TURP) (N/A) - 75 MINS as a surgical intervention.  The patient's history has been reviewed, patient examined, no change in status, stable for surgery.  I have reviewed the patient's chart and labs.  Questions were answered to the patient's satisfaction.     Mathew Phillips D Britney Captain

## 2022-09-13 NOTE — Op Note (Signed)
Preoperative diagnosis: Bladder outlet obstruction secondary to BPH  Postoperative diagnosis:  Bladder outlet obstruction secondary to BPH  Procedure:  Cystoscopy Transurethral resection of the prostate  Surgeon: Jacalyn Lefevre, MD  Anesthesia: General  Complications: None  EBL: Minimal  Specimens: Prostate chips  Indication: Mathew Phillips is a patient with bladder outlet obstruction secondary to benign prostatic hyperplasia. After reviewing the management options for treatment, he elected to proceed with the above surgical procedure(s). We have discussed the potential benefits and risks of the procedure, side effects of the proposed treatment, the likelihood of the patient achieving the goals of the procedure, and any potential problems that might occur during the procedure or recuperation. Informed consent has been obtained.  Description of procedure:  The patient was taken to the operating room and general anesthesia was induced.  The patient was placed in the dorsal lithotomy position, prepped and draped in the usual sterile fashion, and preoperative antibiotics were administered. A preoperative time-out was performed.   Cystourethroscopy was performed.  The patient's urethra was examined and bilobar prostatic hypertrophy with a median lobe .   The bladder was then systematically examined in its entirety. There was no evidence of any bladder tumors, stones, or other mucosal pathology.  The ureteral orifices were identified and marked so as to be avoided during the procedure.  The prostate adenoma was then resected utilizing loop cautery resection with the bipolar cutting loop.  The prostate adenoma from the bladder neck back to the verumontanum was resected beginning at the six o'clock position and then extended to include the right and left lobes of the prostate and anterior prostate. Care was taken not to resect distal to the verumontanum.    Hemostasis was then  achieved with the cautery and the bladder was emptied and reinspected with no significant bleeding noted at the end of the procedure.    A 22Fr 3-way way catheter was then placed into the bladder.  The patient appeared to tolerate the procedure well and without complications.  The patient was able to be awakened and transferred to the recovery unit in satisfactory condition.

## 2022-09-13 NOTE — Anesthesia Preprocedure Evaluation (Signed)
Anesthesia Evaluation  Patient identified by MRN, date of birth, ID band Patient awake    Reviewed: Allergy & Precautions, NPO status , Patient's Chart, lab work & pertinent test results  Airway Mallampati: II  TM Distance: >3 FB Neck ROM: Full    Dental no notable dental hx.    Pulmonary sleep apnea    Pulmonary exam normal        Cardiovascular hypertension, Pt. on medications and Pt. on home beta blockers  Rhythm:Regular Rate:Normal     Neuro/Psych negative neurological ROS  negative psych ROS   GI/Hepatic negative GI ROS, Neg liver ROS,,,  Endo/Other  negative endocrine ROS    Renal/GU negative Renal ROS  negative genitourinary   Musculoskeletal negative musculoskeletal ROS (+)    Abdominal Normal abdominal exam  (+)   Peds  Hematology negative hematology ROS (+)   Anesthesia Other Findings   Reproductive/Obstetrics                             Anesthesia Physical Anesthesia Plan  ASA: 2  Anesthesia Plan: General   Post-op Pain Management: Dilaudid IV   Induction: Intravenous  PONV Risk Score and Plan: 2 and Ondansetron, Midazolam and Treatment may vary due to age or medical condition  Airway Management Planned: LMA  Additional Equipment: None  Intra-op Plan:   Post-operative Plan: Extubation in OR  Informed Consent: I have reviewed the patients History and Physical, chart, labs and discussed the procedure including the risks, benefits and alternatives for the proposed anesthesia with the patient or authorized representative who has indicated his/her understanding and acceptance.     Dental advisory given  Plan Discussed with: CRNA  Anesthesia Plan Comments:        Anesthesia Quick Evaluation

## 2022-09-13 NOTE — Transfer of Care (Signed)
Immediate Anesthesia Transfer of Care Note  Patient: Mathew Phillips  Procedure(s) Performed: Procedure(s) (LRB): TRANSURETHRAL RESECTION OF THE PROSTATE (TURP) (N/A)  Patient Location: PACU  Anesthesia Type: General  Level of Consciousness: awake, oriented, sedated and patient cooperative  Airway & Oxygen Therapy: Patient Spontanous Breathing and Patient connected to face mask oxygen  Post-op Assessment: Report given to PACU RN and Post -op Vital signs reviewed and stable  Post vital signs: Reviewed and stable  Complications: No apparent anesthesia complications Last Vitals:  Vitals Value Taken Time  BP 111/83 09/13/22 1322  Temp    Pulse 73 09/13/22 1324  Resp 15 09/13/22 1324  SpO2 100 % 09/13/22 1324  Vitals shown include unvalidated device data.  Last Pain:  Vitals:   09/13/22 1059  TempSrc: Oral  PainSc: 0-No pain      Patients Stated Pain Goal: 6 ([id number])  Complications: No notable events documented.

## 2022-09-13 NOTE — H&P (Signed)
CC/HPI: cc: BPH with LUTs   08/24/20: 62 year old man comes in with several year history of recurrent urinary tract infections and prostatitis. His urinary symptoms have slowly worsened. Specifically he reports urinary frequency, intermittency and weak urinary stream. He feels like he has to meditate to start his urinary stream. Patient has no family history of prostate cancer in in eyes any gross hematuria. He has experience hematuria during urinary tract infections. Most recent PSA was approximately 1.7 about 2 years ago.   IPSS: 4, 5, 5, 1, 5, 0, 121/35  Mixed QOL 3/6   10/09/20: 62 year old man with a history of BPH with lower urinary tract symptoms and recurrent UTIs and prostatitis returns for follow-up. At his last visit he was started on 0.4 mg Flomax which he has tolerated well. He does feel like it has helped his urinary stream. He came today for a flow rate and PVR. Flow rate showed an obstructed voiding pattern with voided volume of almost 500 cc and Q max of 10.6 mL/sec. Patient has not had any UTIs in interim. His brother is scheduled to have a TURP in a few weeks. [place]   04/19/2021: 62 year old man with a history of recurrent UTIs and BPH with lower urinary tract symptoms managed with 0.8 mg of Flomax daily. He feels like his symptoms are stable. He reports a good flow with occasional stinging after ejaculation with sexual intercourse. He has lost 40 pounds in the last 6 months. He is doing a lot of running. His family is set to run the [place] gob[place] with his 24 year old mother.   10/11/21: The patient is here today for a routine follow-up. He is currently on tamsulosin 0.8 mg qhs and reports sustained improvement in his LUTS. He states that he has a good FOS and feels like he is emptying his bladder well. Nocturia x0. Denies interval UTIs, dysuria or hematuria. He report occasional RE, but isn't bothered by it.   06/29/2022:  62 year old man with a history of BPH with LUTS currently managed with tamsulosin 0.8 mg nightly here for follow-up. Overall his flow is generally good and he has no nocturia as long as he stops fluids by 730-8 PM. He does have occasional retrograde ejaculation and burning after ejaculation. He runs 4 days a week and is interested in getting off all medications.   08/12/22: Here for cysto/flowrate.     ALLERGIES: Codeine Opiates    MEDICATIONS: Metoprolol Succinate 25 mg tablet, extended release 24 hr  Tamsulosin Hcl 0.4 mg capsule 2 capsule PO Q HS     GU PSH: Complex Uroflow - 10/09/2020     NON-GU PSH: None   GU PMH: BPH w/LUTS - 06/29/2022, - 10/11/2021, - 04/19/2021, - 10/09/2020, - 08/24/2020 Urinary Frequency - 06/29/2022, - 04/19/2021, - 10/09/2020, - 08/24/2020 Weak Urinary Stream - 06/29/2022, - 10/11/2021, - 04/19/2021, - 10/09/2020, - 08/24/2020 Encounter for Prostate Cancer screening - 08/24/2020    NON-GU PMH: None   FAMILY HISTORY: None   SOCIAL HISTORY: Marital Status: Married Preferred Language: English; Ethnicity: Not Hispanic Or Latino; Race: White Current Smoking Status: Patient has never smoked.   Tobacco Use Assessment Completed: Used Tobacco in last 30 days? Social Drinker.  Drinks 4+ caffeinated drinks per day.    REVIEW OF SYSTEMS:    GU Review Male:   Patient denies frequent urination, hard to postpone urination, burning/ pain with urination, get up at night to urinate, leakage of  urine, stream starts and stops, trouble starting your stream, have to strain to urinate , erection problems, and penile pain.  Gastrointestinal (Upper):   Patient denies nausea, vomiting, and indigestion/ heartburn.  Gastrointestinal (Lower):   Patient denies diarrhea and constipation.  Constitutional:   Patient denies fever, night sweats, weight loss, and fatigue.  Skin:   Patient denies skin rash/ lesion and itching.  Eyes:   Patient denies blurred vision and double vision.  Ears/  Nose/ Throat:   Patient denies sore throat and sinus problems.  Hematologic/Lymphatic:   Patient denies swollen glands and easy bruising.  Cardiovascular:   Patient denies leg swelling and chest pains.  Respiratory:   Patient denies cough and shortness of breath.  Endocrine:   Patient denies excessive thirst.  Musculoskeletal:   Patient denies back pain and joint pain.  Neurological:   Patient denies headaches and dizziness.  Psychologic:   Patient denies depression and anxiety.   VITAL SIGNS:      08/12/2022 08:36 AM  BP 111/71 mmHg  Pulse 64 /min   GU PHYSICAL EXAMINATION:    Urethral Meatus: Normal size. No lesion, no wart, no discharge, no polyp. Normal location.  Penis: Circumcised, no warts, no cracks. No dorsal Peyronie's plaques, no left corporal Peyronie's plaques, no right corporal Peyronie's plaques, no scarring, no warts. No balanitis, no meatal stenosis.   MULTI-SYSTEM PHYSICAL EXAMINATION:    Constitutional: Well-nourished. No physical deformities. Normally developed. Good grooming.  Neck: Neck symmetrical, not swollen. Normal tracheal position.  Respiratory: No labored breathing, no use of accessory muscles.   Skin: No paleness, no jaundice, no cyanosis. No lesion, no ulcer, no rash.  Neurologic / Psychiatric: Oriented to time, oriented to place, oriented to person. No depression, no anxiety, no agitation.  Eyes: Normal conjunctivae. Normal eyelids.  Ears, Nose, Mouth, and Throat: Left ear no scars, no lesions, no masses. Right ear no scars, no lesions, no masses. Nose no scars, no lesions, no masses. Normal hearing. Normal lips.  Musculoskeletal: Normal gait and station of head and neck.     Complexity of Data:  Records Review:   Previous Patient Records, POC Tool  Urine Test Review:   Urinalysis  Urodynamics Review:   Review Bladder Scan, Review Flow Rate   10/11/21 08/24/20  PSA  Total PSA 1.14 ng/mL 1.24 ng/mL    PROCEDURES:         Flexible Cystoscopy -  52000  Risks, benefits, and some of the potential complications of the procedure were discussed at length with the patient including infection, bleeding, voiding discomfort, urinary retention, fever, chills, sepsis, and others. All questions were answered. Informed consent was obtained. Sterile technique and intraurethral analgesia were used.  Meatus:  Normal size. Normal location. Normal condition.  Urethra:  No strictures.  External Sphincter:  Normal.  Verumontanum:  Normal.  Prostate:  Obstructing lateral lobes. Enlarged median lobe.   Bladder Neck:  Non-obstructing.  Ureteral Orifices:  Normal location. Normal size. Normal shape. Effluxed clear urine.  Bladder:  Mild trabeculation. No tumors. Normal mucosa. No stones.      The lower urinary tract was carefully examined. The procedure was well-tolerated and without complications. Antibiotic instructions were given. Instructions were given to call the office immediately for bloody urine, difficulty urinating, urinary retention, painful or frequent urination, fever, chills, nausea, vomiting or other illness. The patient stated that he understood these instructions and would comply with them.        Flow Rate - 51741  Average  Flow Rate: 4 cc/sec  Flow Time: 1:34 min:sec  Voided Volume: 451 cc  Time of Peak Flow: 0:12 min:sec  Peak Flow Rate: 9 cc/sec  Notes: PVR after flowrate: 71 ml  Total Void Time: 1:43 min:sec           PVR Ultrasound - [id number]  Scanned Volume: 71 cc         Urinalysis w/Scope Dipstick Dipstick Cont'd Micro  Color: Yellow Bilirubin: Neg mg/dL WBC/hpf: NS (Not Seen)  Appearance: Clear Ketones: Neg mg/dL RBC/hpf: 0 - 2/hpf  Specific Gravity: 1.010 Blood: Trace ery/uL Bacteria: Rare (0-9/hpf)  pH: 6.0 Protein: Neg mg/dL Cystals: NS (Not Seen)  Glucose: Neg mg/dL Urobilinogen: 0.2 mg/dL Casts: NS (Not Seen)    Nitrites: Neg Trichomonas: Not Present    Leukocyte Esterase: Neg leu/uL Mucous: Not Present       Epithelial Cells: NS (Not Seen)      Yeast: NS (Not Seen)      Sperm: Not Present    ASSESSMENT:      ICD-10 Details  1 GU:   BPH w/LUTS - N40.1 Chronic, Stable  2   Urinary Frequency - R35.0 Chronic, Stable  3   Weak Urinary Stream - R39.12 Chronic, Stable   PLAN:           Document Letter(s):  Created for Patient: Clinical Summary         Notes:   1. BPH with bladder outlet obstruction: Patient with evidence of obstructing intravesical median lobe on cystoscopy. We discussed that this is a mechanical blockage that will not be improved with medication. We talked about risks and benefits of the various bladder outlet obstruction procedures including TURP, Rezum, HoLEP, Optilume, and UroLift. Patient has decided to move forward with TURP. He understands these risks include but are not limited to bleeding, infection, damage to surrounding structures, prostate regrowth, urinary retention, worsening urinary urgency, retrograde ejaculation. Patient will be scheduled for the next available surgery date

## 2022-09-13 NOTE — Anesthesia Postprocedure Evaluation (Signed)
Anesthesia Post Note  Patient: Mathew Phillips  Procedure(s) Performed: TRANSURETHRAL RESECTION OF THE PROSTATE (TURP) (Prostate)     Patient location during evaluation: PACU Anesthesia Type: General Level of consciousness: awake and alert Pain management: pain level controlled Vital Signs Assessment: post-procedure vital signs reviewed and stable Respiratory status: spontaneous breathing, nonlabored ventilation and respiratory function stable Cardiovascular status: blood pressure returned to baseline and stable Postop Assessment: no apparent nausea or vomiting Anesthetic complications: no   No notable events documented.  Last Vitals:  Vitals:   09/13/22 1413 09/13/22 1427  BP:  108/75  Pulse: (!) 48 (!) 50  Resp: 15 15  Temp:  36.4 C  SpO2: 100% 100%    Last Pain:  Vitals:   09/13/22 1415  TempSrc:   PainSc: 3                  Lynda Rainwater

## 2022-09-14 ENCOUNTER — Encounter (HOSPITAL_BASED_OUTPATIENT_CLINIC_OR_DEPARTMENT_OTHER): Payer: Self-pay | Admitting: Urology

## 2022-09-14 DIAGNOSIS — I10 Essential (primary) hypertension: Secondary | ICD-10-CM | POA: Diagnosis not present

## 2022-09-14 DIAGNOSIS — N320 Bladder-neck obstruction: Secondary | ICD-10-CM | POA: Diagnosis not present

## 2022-09-14 DIAGNOSIS — N401 Enlarged prostate with lower urinary tract symptoms: Secondary | ICD-10-CM | POA: Diagnosis not present

## 2022-09-14 DIAGNOSIS — G4730 Sleep apnea, unspecified: Secondary | ICD-10-CM | POA: Diagnosis not present

## 2022-09-14 DIAGNOSIS — N138 Other obstructive and reflux uropathy: Secondary | ICD-10-CM | POA: Diagnosis not present

## 2022-09-14 DIAGNOSIS — Z79899 Other long term (current) drug therapy: Secondary | ICD-10-CM | POA: Diagnosis not present

## 2022-09-14 DIAGNOSIS — I480 Paroxysmal atrial fibrillation: Secondary | ICD-10-CM | POA: Diagnosis not present

## 2022-09-14 MED ORDER — DIPHENHYDRAMINE HCL 50 MG/ML IJ SOLN
INTRAMUSCULAR | Status: AC
  Filled 2022-09-14: qty 1

## 2022-09-14 MED ORDER — CEFAZOLIN SODIUM-DEXTROSE 2-4 GM/100ML-% IV SOLN
INTRAVENOUS | Status: AC
  Filled 2022-09-14: qty 100

## 2022-09-14 MED ORDER — OXYCODONE HCL 5 MG PO TABS
ORAL_TABLET | ORAL | Status: AC
  Filled 2022-09-14: qty 1

## 2022-09-14 MED ORDER — ACETAMINOPHEN 325 MG PO TABS
ORAL_TABLET | ORAL | Status: AC
  Filled 2022-09-14: qty 2

## 2022-09-14 MED ORDER — CEPHALEXIN 500 MG PO CAPS: 500.0000 mg | ORAL_CAPSULE | Freq: Every day | ORAL | 0 refills | Status: AC

## 2022-09-14 NOTE — Discharge Summary (Signed)
Date of admission: 09/13/2022  Date of discharge: 09/14/2022  Admission diagnosis: BPH with BOO  Discharge diagnosis: BPH with BOO  Secondary diagnoses:  Patient Active Problem List   Diagnosis Date Noted   BPH with obstruction/lower urinary tract symptoms 09/13/2022    Procedures performed: Procedure(s): TRANSURETHRAL RESECTION OF THE PROSTATE (TURP)  History and Physical: For full details, please see admission history and physical. Briefly, Mathew Phillips is a 62 y.o. year old patient with BPH with BOO.  He underwent TURP on 09/13/22.  PE NAD, alert and oriented  Nonlabored respirations Foley draining clear yellow tinged urine, CBI clamped   Hospital Course: Patient tolerated the procedure well.  He was then transferred to the floor after an uneventful PACU stay.  His hospital course was uncomplicated.  On POD#1 he had met discharge criteria: was eating a regular diet, was up and ambulating independently,  pain was well controlled, CBI was weaned, and was ready to for discharge.   Laboratory values:  Recent Labs    09/13/22 1117  HGB 15.3  HCT 45.0   Recent Labs    09/13/22 1117  NA 144  K 3.7  CL 104  GLUCOSE 87  BUN 13  CREATININE 0.80   No results for input(s): "LABPT", "INR" in the last 72 hours. No results for input(s): "LABURIN" in the last 72 hours. Results for orders placed or performed during the hospital encounter of 06/22/22  SARS Coronavirus 2 by RT PCR (hospital order, performed in [facility] hospital lab) *cepheid single result test* Anterior Nasal Swab     Status: None   Collection Time: 06/22/22  9:18 AM   Specimen: Anterior Nasal Swab  Result Value Ref Range Status   SARS Coronavirus 2 by RT PCR NEGATIVE NEGATIVE Final    Comment: (NOTE) SARS-CoV-2 target nucleic acids are NOT DETECTED.  The SARS-CoV-2 RNA is generally detectable in upper and lower respiratory specimens during the acute phase of infection. The lowest concentration of  SARS-CoV-2 viral copies this assay can detect is 250 copies / mL. A negative result does not preclude SARS-CoV-2 infection and should not be used as the sole basis for treatment or other patient management decisions.  A negative result may occur with improper specimen collection / handling, submission of specimen other than nasopharyngeal swab, presence of viral mutation(s) within the areas targeted by this assay, and inadequate number of viral copies (<250 copies / mL). A negative result must be combined with clinical observations, patient history, and epidemiological information.  Fact Sheet for Patients:   https://www.patel.info/  Fact Sheet for Healthcare Providers: https://hall.com/  This test is not yet approved or  cleared by the [place] FDA and has been authorized for detection and/or diagnosis of SARS-CoV-2 by FDA under an Emergency Use Authorization (EUA).  This EUA will remain in effect (meaning this test can be used) for the duration of the COVID-19 declaration under Section 564(b)(1) of the Act, 21 U.S.C. section 360bbb-3(b)(1), unless the authorization is terminated or revoked sooner.  Performed at [facility], [place] [address] [place], [place] [address]     Disposition: Home  Discharge instruction: The patient was instructed to be ambulatory but told to refrain from heavy lifting, strenuous activity, or driving. Discharged home with foley.  Discharge medications:  Allergies as of 09/14/2022       Reactions   Yellow Jacket Venom [bee Venom] Anaphylaxis, Hives, Swelling   Codeine Hives, Rash        Medication  List     TAKE these medications    aspirin EC 81 MG tablet Take 81 mg by mouth daily. Swallow whole.   cephALEXin 500 MG capsule Commonly known as: KEFLEX Take 1 capsule (500 mg total) by mouth daily for 3 days.   metoprolol succinate 25 MG 24 hr tablet Commonly known as: Toprol XL Take 1  tablet (25 mg total) by mouth daily.   multivitamin with minerals Tabs tablet Take 1 tablet by mouth daily.   tamsulosin 0.4 MG Caps capsule Commonly known as: FLOMAX Take 0.8 mg by mouth daily.        Followup:   Follow-up Information     [facility] Follow up on 09/15/2022.   Why: 9:15 Contact information: [place] [phone]

## 2022-09-16 LAB — SURGICAL PATHOLOGY

## 2022-09-30 DIAGNOSIS — D141 Benign neoplasm of larynx: Secondary | ICD-10-CM | POA: Diagnosis not present

## 2022-09-30 DIAGNOSIS — G4733 Obstructive sleep apnea (adult) (pediatric): Secondary | ICD-10-CM | POA: Diagnosis not present

## 2022-10-17 DIAGNOSIS — R8271 Bacteriuria: Secondary | ICD-10-CM | POA: Diagnosis not present

## 2022-10-17 DIAGNOSIS — R3914 Feeling of incomplete bladder emptying: Secondary | ICD-10-CM | POA: Diagnosis not present

## 2022-10-17 DIAGNOSIS — N401 Enlarged prostate with lower urinary tract symptoms: Secondary | ICD-10-CM | POA: Diagnosis not present

## 2022-10-31 DIAGNOSIS — E042 Nontoxic multinodular goiter: Secondary | ICD-10-CM | POA: Diagnosis not present

## 2022-11-01 DIAGNOSIS — Z0000 Encounter for general adult medical examination without abnormal findings: Secondary | ICD-10-CM | POA: Diagnosis not present

## 2022-11-01 DIAGNOSIS — Z13220 Encounter for screening for lipoid disorders: Secondary | ICD-10-CM | POA: Diagnosis not present

## 2022-11-01 DIAGNOSIS — Z23 Encounter for immunization: Secondary | ICD-10-CM | POA: Diagnosis not present

## 2022-11-01 DIAGNOSIS — Z130 Encounter for screening for diseases of the blood and blood-forming organs and certain disorders involving the immune mechanism: Secondary | ICD-10-CM | POA: Diagnosis not present

## 2022-11-08 ENCOUNTER — Other Ambulatory Visit: Payer: Self-pay | Admitting: Cardiovascular Disease

## 2022-12-19 IMAGING — US US FNA BIOPSY THYROID 1ST LESION
1 series · 11 of 11 positions shown · non-contrast
Comparison: Outside ultrasound 10/08/2021

MEDICATIONS:
1% lidocaine local

COMPLICATIONS:
None immediate.

INDICATION: Indeterminate thyroid nodule

EXAM:
ULTRASOUND GUIDED FINE NEEDLE ASPIRATION OF INDETERMINATE THYROID
NODULE
TECHNIQUE: Informed written consent was obtained from the patient after a
discussion of the risks, benefits and alternatives to treatment.
Questions regarding the procedure were encouraged and answered. A
timeout was performed prior to the initiation of the procedure.

[Series 1: us fna biopsy thyroid 1st lesion · 0.06mm/px · 11 acquisitions, 11 frames shown]
[im 1/11]
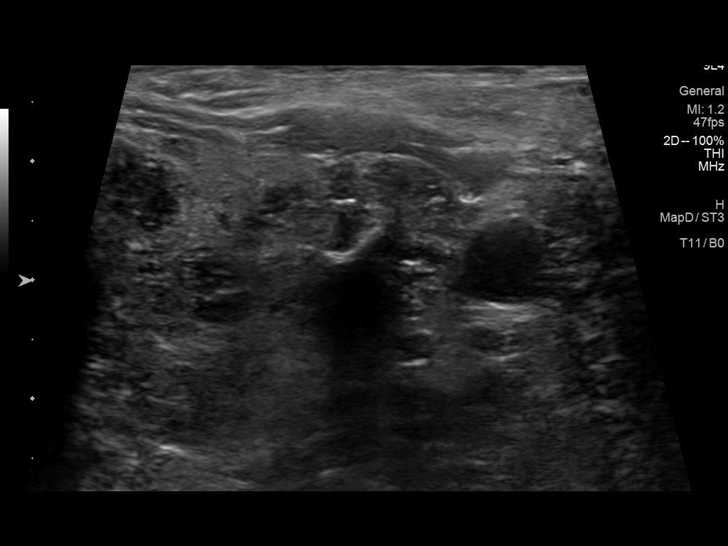
[im 2/11]
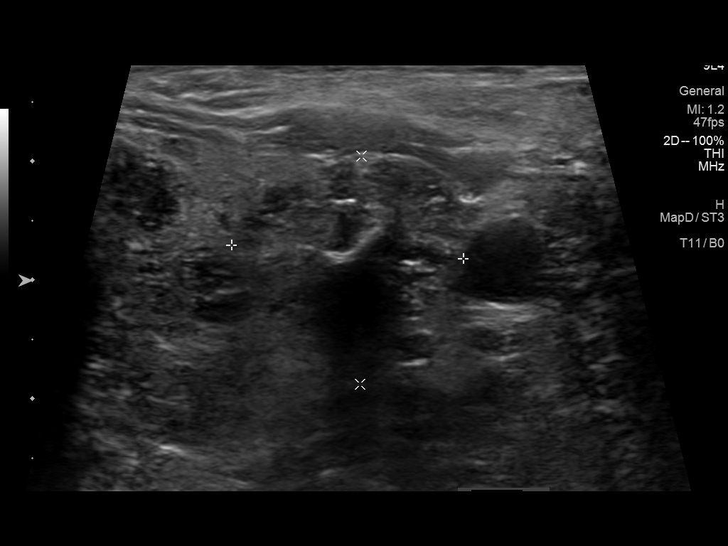
[im 3/11]
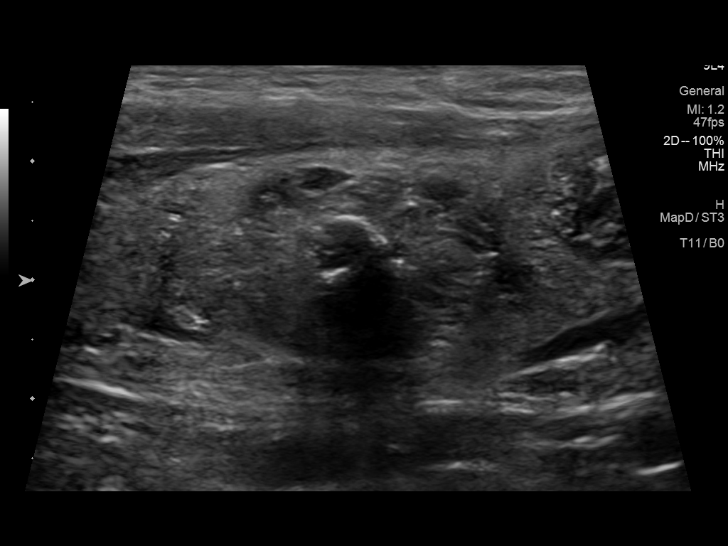
[im 4/11]
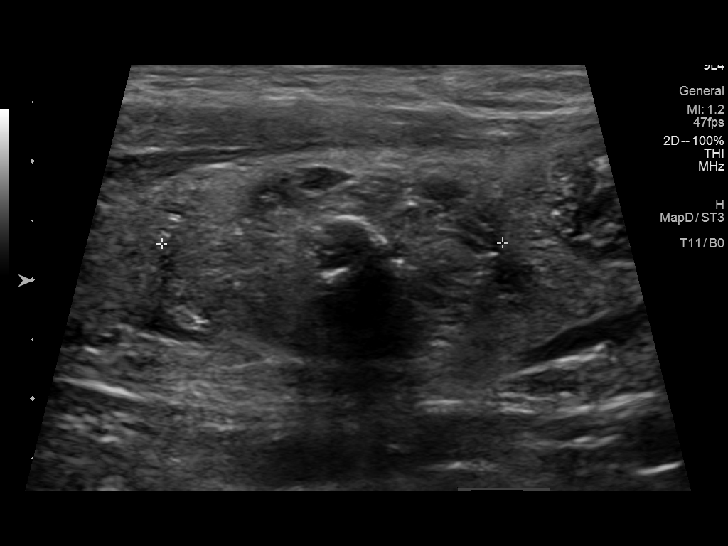
[im 5/11]
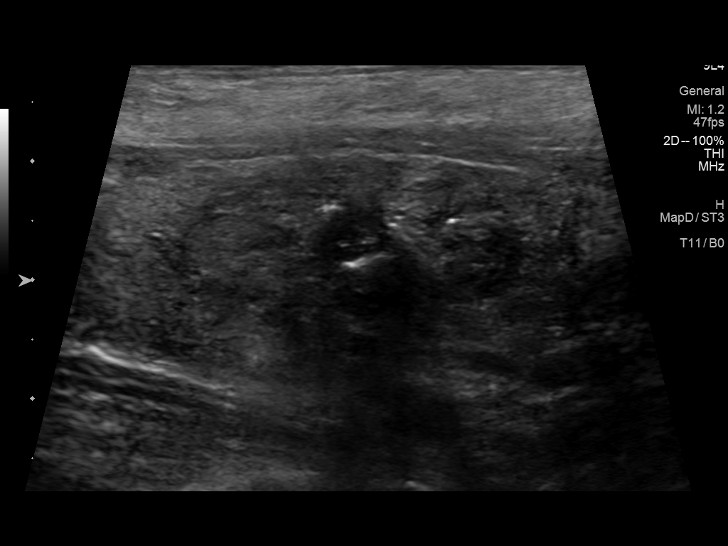
[im 6/11]
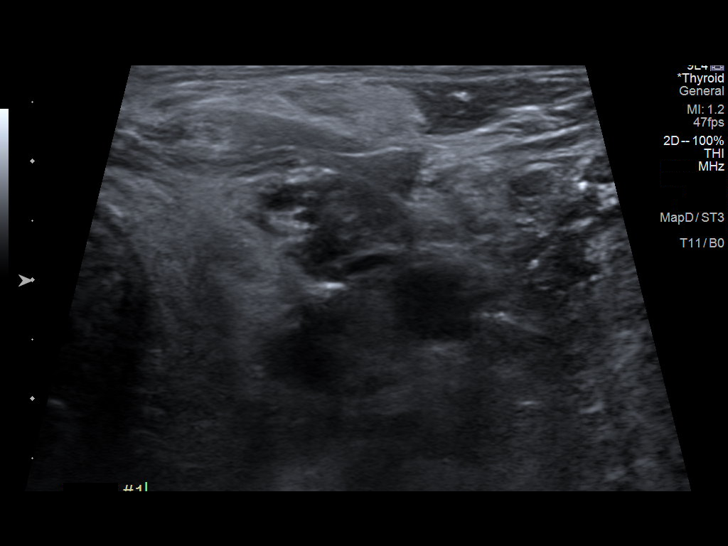
[im 7/11]
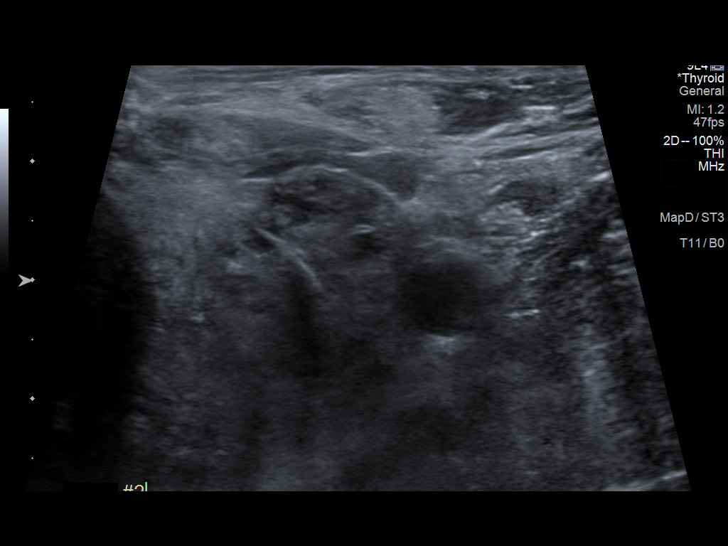
[im 8/11]
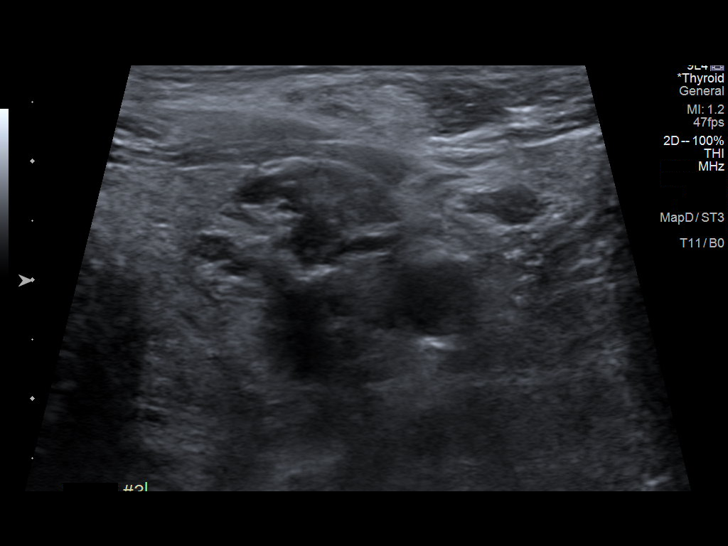
[im 9/11]
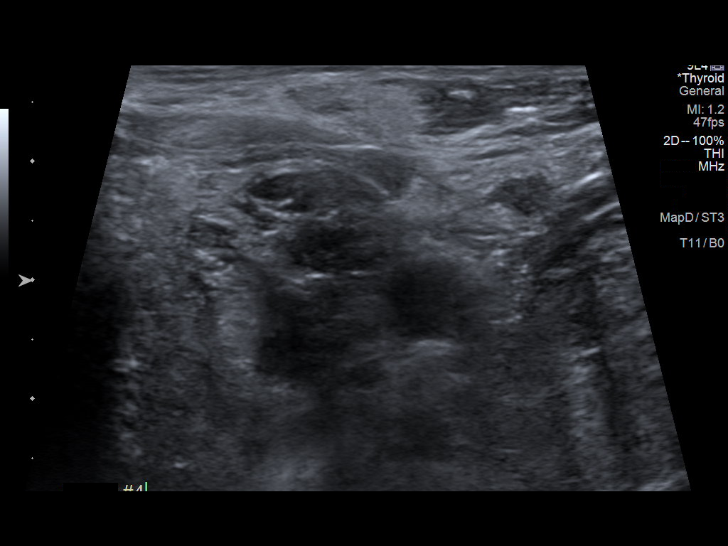
[im 10/11]
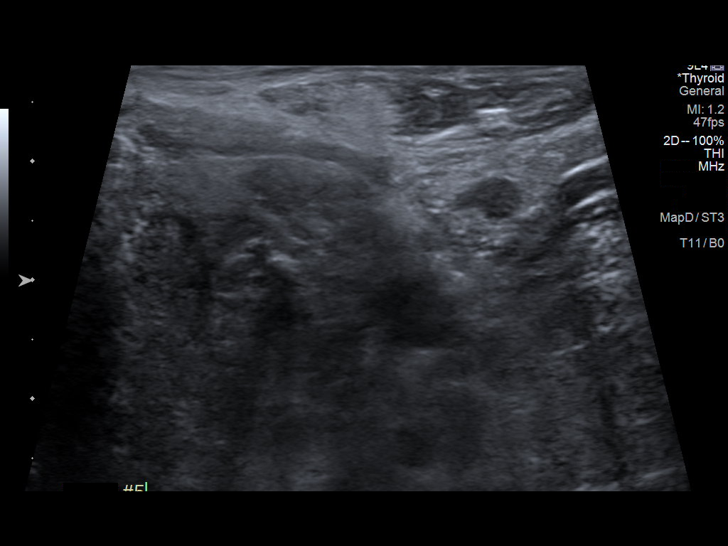
[im 11/11]
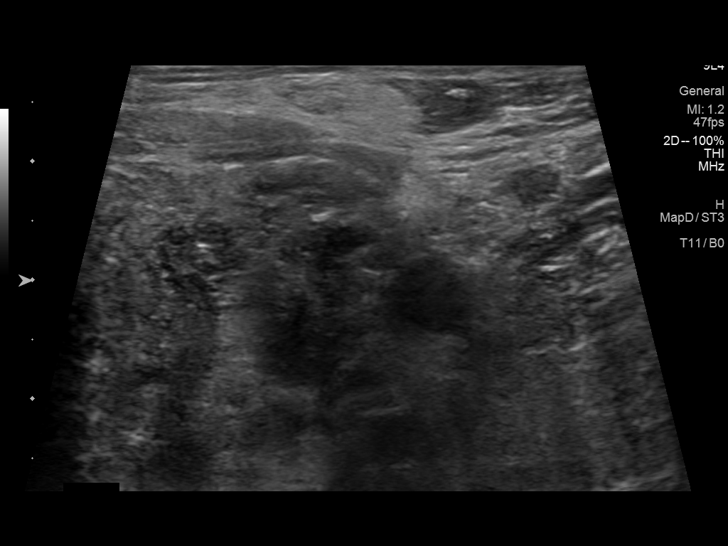

[11 of 11 positions shown; findings below may reference images not displayed]

Pre-procedural ultrasound scanning demonstrated unchanged size and
appearance of the indeterminate nodule within the left inferior
thyroid

The procedure was planned. The neck was prepped in the usual sterile
fashion, and a sterile drape was applied covering the operative
field. A timeout was performed prior to the initiation of the
procedure. Local anesthesia was provided with 1% lidocaine.

Under direct ultrasound guidance, 5 FNA biopsies were performed of
the 2.6 cm left inferior thyroid dominant nodule with a 25 gauge
needle. Multiple ultrasound images were saved for procedural
documentation purposes. The samples were prepared and submitted to
pathology.

Limited post procedural scanning was negative for hematoma or
additional complication. Dressings were placed. The patient
tolerated the above procedures procedure well without immediate
postprocedural complication.
FINDINGS: Nodule reference number based on prior diagnostic ultrasound:
Unlabeled

Maximum size: 2.6 cm

Location: Left; Inferior

ACR TI-RADS risk category: TR4 (4-6 points)

Reason for biopsy: meets ACR TI-RADS criteria

Ultrasound imaging confirms appropriate placement of the needles
within the thyroid nodule.
IMPRESSION: Technically successful ultrasound guided fine needle aspiration of
the 2.6 cm left inferior thyroid dominant TR 4 nodule.

## 2023-01-10 DIAGNOSIS — Z23 Encounter for immunization: Secondary | ICD-10-CM | POA: Diagnosis not present

## 2023-02-17 DIAGNOSIS — R3912 Poor urinary stream: Secondary | ICD-10-CM | POA: Diagnosis not present

## 2023-02-17 DIAGNOSIS — R350 Frequency of micturition: Secondary | ICD-10-CM | POA: Diagnosis not present

## 2023-02-17 DIAGNOSIS — N401 Enlarged prostate with lower urinary tract symptoms: Secondary | ICD-10-CM | POA: Diagnosis not present

## 2023-02-21 DIAGNOSIS — Z01818 Encounter for other preprocedural examination: Secondary | ICD-10-CM | POA: Diagnosis not present

## 2023-03-15 DIAGNOSIS — K640 First degree hemorrhoids: Secondary | ICD-10-CM | POA: Diagnosis not present

## 2023-03-15 DIAGNOSIS — Z1211 Encounter for screening for malignant neoplasm of colon: Secondary | ICD-10-CM | POA: Diagnosis not present

## 2023-03-15 DIAGNOSIS — D122 Benign neoplasm of ascending colon: Secondary | ICD-10-CM | POA: Diagnosis not present

## 2023-03-23 DIAGNOSIS — Z8249 Family history of ischemic heart disease and other diseases of the circulatory system: Secondary | ICD-10-CM | POA: Diagnosis not present

## 2023-03-23 DIAGNOSIS — J0100 Acute maxillary sinusitis, unspecified: Secondary | ICD-10-CM | POA: Diagnosis not present

## 2023-03-24 ENCOUNTER — Other Ambulatory Visit: Payer: Self-pay | Admitting: Family Medicine

## 2023-03-24 DIAGNOSIS — Z8249 Family history of ischemic heart disease and other diseases of the circulatory system: Secondary | ICD-10-CM

## 2023-03-27 ENCOUNTER — Ambulatory Visit (HOSPITAL_BASED_OUTPATIENT_CLINIC_OR_DEPARTMENT_OTHER)
Admission: RE | Admit: 2023-03-27 | Discharge: 2023-03-27 | Disposition: A | Discharge status: Home / Self Care | Payer: BC Managed Care – PPO | Source: Ambulatory Visit | Attending: Family Medicine | Admitting: Family Medicine

## 2023-03-27 DIAGNOSIS — Z8249 Family history of ischemic heart disease and other diseases of the circulatory system: Secondary | ICD-10-CM | POA: Insufficient documentation

## 2023-05-25 DIAGNOSIS — B070 Plantar wart: Secondary | ICD-10-CM | POA: Diagnosis not present

## 2023-05-25 DIAGNOSIS — D225 Melanocytic nevi of trunk: Secondary | ICD-10-CM | POA: Diagnosis not present

## 2023-05-25 DIAGNOSIS — L821 Other seborrheic keratosis: Secondary | ICD-10-CM | POA: Diagnosis not present

## 2023-06-17 DIAGNOSIS — J209 Acute bronchitis, unspecified: Secondary | ICD-10-CM | POA: Diagnosis not present

## 2023-06-17 DIAGNOSIS — R059 Cough, unspecified: Secondary | ICD-10-CM | POA: Diagnosis not present

## 2023-07-18 ENCOUNTER — Other Ambulatory Visit: Payer: Self-pay | Admitting: Cardiovascular Disease

## 2023-07-28 ENCOUNTER — Ambulatory Visit: Payer: BLUE CROSS/BLUE SHIELD | Attending: Cardiovascular Disease | Admitting: Cardiovascular Disease

## 2023-07-28 VITALS — HR 78 | Ht 73.0 in | Wt 195.6 lb

## 2023-07-28 DIAGNOSIS — Q676 Pectus excavatum: Secondary | ICD-10-CM | POA: Diagnosis not present

## 2023-07-28 DIAGNOSIS — I480 Paroxysmal atrial fibrillation: Secondary | ICD-10-CM

## 2023-07-28 DIAGNOSIS — Z8679 Personal history of other diseases of the circulatory system: Secondary | ICD-10-CM | POA: Diagnosis not present

## 2023-07-28 DIAGNOSIS — Z9889 Other specified postprocedural states: Secondary | ICD-10-CM

## 2023-07-28 DIAGNOSIS — R002 Palpitations: Secondary | ICD-10-CM

## 2023-07-28 NOTE — Patient Instructions (Signed)
 Medication Instructions:  No medication changes were made during today's visit  *If you need a refill on your cardiac medications before your next appointment, please call your pharmacy*   Lab Work: No labs were ordered during today's visit.  If you have labs (blood work) drawn today and your tests are completely normal, you will receive your results only by: MyChart Message (if you have MyChart) OR A paper copy in the mail If you have any lab test that is abnormal or we need to change your treatment, we will call you to review the results.   Testing/Procedures: No procedures ordered today.    Follow-Up: At [facility], you and your health needs are our priority.  As part of our continuing mission to provide you with exceptional heart care, we have created designated Provider Care Teams.  These Care Teams include your primary Cardiologist (physician) and Advanced Practice Providers (APPs -  Physician Assistants and Nurse Practitioners) who all work together to provide you with the care you need, when you need it.  We recommend signing up for the patient portal called MyChart.  Sign up information is provided on this After Visit Summary.  MyChart is used to connect with patients for Virtual Visits (Telemedicine).  Patients are able to view lab/test results, encounter notes, upcoming appointments, etc.  Non-urgent messages can be sent to your provider as well.   To learn more about what you can do with MyChart, go to [email or web address].    Your next appointment:   1 year(s)  Provider:   Shelda Bruckner, MD    Other Instruction A letter will be mailed to you as a reminder to call the office for your follow up appointment.   Thank you for choosing [facility]!

## 2023-07-28 NOTE — Progress Notes (Signed)
 Cardiology Office Note    Date:  07/30/2023   ID:  Mathew Phillips, DOB 1960/08/10, MRN [id number]  PCP:  Kahoano, Haku K, MD  Cardiologist:  Debby Sor, MD   No chief complaint on file.   History of Present Illness:  Mathew Phillips is a 63 y.o. male  who has a history of atrial tachycardia, atrial fibrillation, status post ablation, OSA on CPAP, and lower extremity varicosities status post ablation.  I last evaluated him in a telemedicine visit on July 29, 2020.  He presents for follow-up evaluation.   In March 2014 was evaluated by Dr Donna at [facility] heart specialists.  He had developed an episode of wide complex tachycardia which was picked up on outpatient monitoring in 2013 that was presumed to be VT.  He underwent cardiac catheterization, echo, and EPS as well as cardiac MRI.  Catheterization showed an EF of 60%; TEE revealed a normal EF.  EPS did not reveal inducible arrhythmia and he underwent successful cardioversion to sinus rhythm.  A cardiac MRI did not reveal infiltrative disease, and there was no evidence for RV dysplasia.  He was discharged with flecainide as well as metoprolol .   He underwent [facility] for atrial fibrillation.  Subsequently, he has stabilized and has not had any recurrent awareness of atrial fibrillation.  He tells me that he was diagnosed with obstructive sleep apnea one year ago was started on CPAP.  He self discontinued Toprol  6 months ago.  He denies any recent palpitations.  He denies any change in exercise tolerance.  He denies PND, orthopnea.    He established cardiology care with me after moving back to [place] from [place].  I recommended that he undergo a 2D echo Doppler study which was done on July 10, 2017.  This revealed normal LV systolic and diastolic function.  EF is 55 to 60%.  Atrial size was normal.  He had normal pulmonary pressures.   I evaluated him in January 2021.  Over the several months previously he was continuing to do  well.  I had given him a prescription for metoprolol  tartrate at a prior visit but he had not required any use. He has obstructive sleep apnea, recently has been obtaining his supplies online.  He uses a fullface mask and admits to 100% compliance.  Recently, he has had issues of worsening lower leg edema.  He has varicose veins.  This past weekend apparently he noticed more leg swelling.  He was advised to go to the ER.  Lower extremity Doppler study was negative for DVT.  He has undergone successful laser ablation therapy of the left great saphenous vein and is followed by Dr. Medford Fireman.   He admits to continued CPAP use.  In December 2020 he developed Covid and had 3 months of extreme fatigue following his infection.  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  Recently he and his wife started to walk on a regular basis and walk anywhere from 1/2 mile to 3 miles several times a week.  During his walks his heart rate has been in the 125-130 range.  I evaluated him in a telemedicine visit in December 2021.  At that time, he began to notice fluttering and palpitations with mild shortness of breath without dizziness.  When he feels the palpitations he stops activity and rests and the symptoms resolve.  During time of activity his smart watch had  documented a heart rate of 180 bpm.  At that time he had not used his prior metoprolol .  He denied any chest pain.  He had called the office and was added onto my schedule for telemedicine visit.  During that evaluation, I recommended he reinstitute metoprolol  succinate at 25 mg daily and also schedule him for follow-up echo Doppler therapy.     Mr. Krass underwent his echo evaluation on June 22, 2020 which showed normal LV systolic function with EF at 60 to 65%.  There was evidence for moderate RA and RV dilation with mild left atrial enlargement.  He had normal diastolic parameters.  Since  reinitiating metoprolol  succinate, he has noticed complete resolution of his prior symptoms.  He continues to be active.  He typically has several walks a day and oftentimes will walk for a little break at work for half mile and then later in the day walk for over 2 miles.  On average he walks at least 8000 - 10,000 steps during the week, and on weekends 14,000-15,000 steps.  He denies any anginal symptoms.  He denies any exertional shortness of breath.  He admits to continued 100% compliance with CPAP therapy.  He had recently seen his primary care physician Dr. Shawnee at [facility].  Laboratory was drawn.  He recalls that his total cholesterol was in the range of 175.  He did not know the specifics of his laboratory but that his vitamin D level was mildly low and he was started on supplemental therapy.    Since I last saw him, he was evaluated by Barnie Hila, NP on July 19, 2022.  Presently, he has been followed by Dr. Haku Kahoano.  Since I saw him, he was evaluated by Dr. Carlie of ENT and had papillomas removed.  He was then retested for sleep apnea and apparently AHI was less than 5.0.  He is no longer using CPAP therapy but has a customized oral appliance and has been sleeping well.  He underwent a CT cardiac scoring assessment on March 27, 2023.  Calcium score was 0.  He denies any chest pain.  He had undergone recent vocal cord surgery.  He tells me he just retired at the end of December 2024.  He remains active and has continued to run.  His recent 5K time had increased from 29 minutes previously to 39 minutes.  He presents for follow-up evaluation.   Past Medical History:  Diagnosis Date   Atrial tachycardia    Bladder outlet obstruction    BPH (benign prostatic hyperplasia)    urologist--- dr pace   History of benign neoplasm of larynx 06/22/2022   s/p  exsicion vocal cord lesion--- laryngeal papilloma by dr carlie , not dysplsia or malignant   Incomplete emptying of bladder     Intermittent palpitations    Multiple thyroid  nodules    s/p  bx 11-29-2021 result in epic done by dr gorris, benign   OSA on CPAP    per pt uses nightly   PAF (paroxysmal atrial fibrillation) [facility]) 2013   cardiologist--- dr oneida. burnard;   per cardiology note pt had caridac cath , event monitor, cardiac MRI, successful , and ablation in 2013DCCV s/p  ablation 2013  in [place], [place]   Varicose veins of left lower extremity    s/p  GSV ablation LLE and stabbing by dr eliza 02/ 2020    Past Surgical History:  Procedure Laterality Date   ARTHOSCOPIC ROTAOR CUFF REPAIR Bilateral  2013 and 2012   CARDIAC CATHETERIZATION  2013   [place]   CARDIAC ELECTROPHYSIOLOGY MAPPING AND ABLATION  2013   in [place];   ablation Afib   ENDOVENOUS ABLATION SAPHENOUS VEIN W/ LASER Left 08/09/2018   endovenous laser ablation left greater saphenous vein and stab phlebectomy >20 incisions left leg by Lonni Blade MD   KNEE ARTHROSCOPY Bilateral    1982 and 2008   MICROLARYNGOSCOPY WITH CO2 LASER AND EXCISION OF VOCAL CORD LESION Bilateral 06/22/2022   Procedure: MICROLARYNGOSCOPY WITH CO2 LASER AND EXCISION OF VOCAL CORD LESION;  Surgeon: Carlie Clark, MD;  Location: [facility] OR;  Service: ENT;  Laterality: Bilateral;   TRANSURETHRAL RESECTION OF PROSTATE N/A 09/13/2022   Procedure: TRANSURETHRAL RESECTION OF THE PROSTATE (TURP);  Surgeon: Elisabeth Valli BIRCH, MD;  Location: [facility];  Service: Urology;  Laterality: N/A;  75 MINS   WRIST SURGERY Left 2010    Current Medications: Outpatient Medications Prior to Visit  Medication Sig Dispense Refill   aspirin EC 81 MG tablet Take 81 mg by mouth daily. Swallow whole.     metoprolol  succinate (TOPROL -XL) 25 MG 24 hr tablet TAKE 1 TABLET DAILY 90 tablet 0   Multiple Vitamin (MULTIVITAMIN WITH MINERALS) TABS tablet Take 1 tablet by mouth daily.     tamsulosin  (FLOMAX ) 0.4 MG CAPS capsule Take 0.8 mg by mouth daily.     No  facility-administered medications prior to visit.     Allergies:   Yellow jacket venom [bee venom] and Codeine   Social History   Socioeconomic History   Marital status: Married    Spouse name: Not on file   Number of children: Not on file   Years of education: Not on file   Highest education level: Not on file  Occupational History   Not on file  Tobacco Use   Smoking status: Never   Smokeless tobacco: Never  Vaping Use   Vaping status: Never Used  Substance and Sexual Activity   Alcohol use: Not Currently    Comment: rarely   Drug use: Never   Sexual activity: Not on file  Other Topics Concern   Not on file  Social History Narrative   Not on file   Social Drivers of Health   Financial Resource Strain: Not on file  Food Insecurity: Low Risk  (09/30/2022)   [facility]   Hunger Vital Sign    Worried About Running Out of Food in the Last Year: Never true    Ran Out of Food in the Last Year: Never true  Transportation Needs: No Transportation Needs (09/30/2022)   [facility]   Transportation    In the past 12 months, has lack of reliable transportation kept you from medical appointments, meetings, work or from getting things needed for daily living? : No  Physical Activity: Not on file  Stress: Not on file  Social Connections: Not on file     Family History:  The patient's family history includes COPD in his mother; Cancer in his father; Coronary artery disease in his mother.   ROS General: Negative; No fevers, chills, or night sweats;  HEENT: Negative; No changes in vision or hearing, sinus congestion, difficulty swallowing Pulmonary: Negative; No cough, wheezing, shortness of breath, hemoptysis Cardiovascular: Negative; No chest pain, presyncope, syncope, palpitations GI: Negative; No nausea, vomiting, diarrhea, or abdominal pain GU: Negative; No dysuria, hematuria, or difficulty  voiding Musculoskeletal: Negative; no myalgias,  joint pain, or weakness Hematologic/Oncology: Negative; no easy bruising, bleeding Endocrine: Negative; no heat/cold intolerance; no diabetes Neuro: Negative; no changes in balance, headaches Skin: Negative; No rashes or skin lesions Psychiatric: Negative; No behavioral problems, depression Sleep: OSA, previously on CPAP, now with oral appliance Other comprehensive 14 point system review is negative.   PHYSICAL EXAM:   VS:  Pulse 78   Ht 6' 1 (1.854 m)   Wt 195 lb 9.6 oz (88.7 kg)   SpO2 98%   BMI 25.81 kg/m     Repeat blood pressure by me was 100/66.  Wt Readings from Last 3 Encounters:  07/28/23 195 lb 9.6 oz (88.7 kg)  09/13/22 194 lb 3.2 oz (88.1 kg)  07/19/22 192 lb 3.2 oz (87.2 kg)    General: Alert, oriented, no distress.  Skin: normal turgor, no rashes, warm and dry HEENT: Normocephalic, atraumatic. Pupils equal round and reactive to light; sclera anicteric; extraocular muscles intact; Nose without nasal septal hypertrophy Mouth/Parynx benign; Mallinpatti scale 3 Neck: No JVD, no carotid bruits; normal carotid upstroke Lungs: clear to ausculatation and percussion; no wheezing or rales Chest wall: Pectus excavatum chest wall deformity; no tenderness to palpitation Heart: PMI not displaced, RRR, s1 s2 normal, 1/6 systolic murmur, no diastolic murmur, no rubs, gallops, thrills, or heaves Abdomen: soft, nontender; no hepatosplenomehaly, BS+; abdominal aorta nontender and not dilated by palpation. Back: no CVA tenderness Pulses 2+ Musculoskeletal: full range of motion, normal strength, no joint deformities Extremities: no clubbing cyanosis or edema, Homan's sign negative  Neurologic: grossly nonfocal; Cranial nerves grossly wnl Psychologic: Normal mood and affect   Studies/Labs Reviewed:   EKG Interpretation Date/Time:  Friday July 28 2023 08:24:07 EST Ventricular Rate:  78 PR Interval:  176 QRS  Duration:  96 QT Interval:  390 QTC Calculation: 444 R Axis:   66  Text Interpretation: Normal sinus rhythm Incomplete right bundle branch block When compared with ECG of 22-Jun-2022 09:26, Vent. rate has increased BY  28 BPM Confirmed by Burnard Ned ([id number]) on 07/30/2023 1:09:51 PM    Recent Labs:    Latest Ref Rng & Units 09/13/2022   11:17 AM 06/22/2022   10:19 AM 07/04/2017    8:26 AM  BMP  Glucose 70 - 99 mg/dL 87  88  94   BUN 6 - 20 mg/dL 13  15  13    Creatinine 0.61 - 1.24 mg/dL 9.19  9.07  9.09   BUN/Creat Ratio 9 - 20   14   Sodium 135 - 145 mmol/L 144  140  146   Potassium 3.5 - 5.1 mmol/L 3.7  3.8  4.3   Chloride 98 - 111 mmol/L 104  106  109   CO2 22 - 32 mmol/L  22  23   Calcium 8.9 - 10.3 mg/dL  8.7  9.0         Latest Ref Rng & Units 07/04/2017    8:26 AM  Hepatic Function  Total Protein 6.0 - 8.5 g/dL 6.8   Albumin 3.5 - 5.5 g/dL 4.3   AST 0 - 40 IU/L 15   ALT 0 - 44 IU/L 22   Alk Phosphatase 39 - 117 IU/L 83   Total Bilirubin 0.0 - 1.2 mg/dL 0.3        Latest Ref Rng & Units 09/13/2022   11:17 AM 06/22/2022   10:19 AM 07/04/2017    8:26 AM  CBC  WBC 4.0 - 10.5 K/uL  6.0  6.5   Hemoglobin  13.0 - 17.0 g/dL 84.6  86.8  86.0   Hematocrit 39.0 - 52.0 % 45.0  41.7  40.1   Platelets 150 - 400 K/uL  236  302    Lab Results  Component Value Date   MCV 92.3 06/22/2022   MCV 87 07/04/2017   Lab Results  Component Value Date   TSH 0.898 07/04/2017   No results found for: HGBA1C   BNP No results found for: BNP  ProBNP No results found for: PROBNP   Lipid Panel     Component Value Date/Time   CHOL 146 07/04/2017 0826   TRIG 51 07/04/2017 0826   HDL 40 07/04/2017 0826   CHOLHDL 3.7 07/04/2017 0826   LDLCALC 96 07/04/2017 0826   LABVLDL 10 07/04/2017 0826     RADIOLOGY: No results found.   Additional studies/ records that were reviewed today include:   ECHO 06/22/2020 IMPRESSIONS   1. Left ventricular ejection fraction, by  estimation, is 60 to 65%. Left  ventricular ejection fraction by 3D volume is 62 %. The left ventricle has  normal function. The left ventricle has no regional wall motion  abnormalities. Left ventricular diastolic   parameters were normal.   2. Right ventricular systolic function is normal. The right ventricular  size is moderately enlarged.   3. Left atrial size was mildly dilated.   4. Right atrial size was moderately dilated.   5. The mitral valve is normal in structure. No evidence of mitral valve  regurgitation. No evidence of mitral stenosis.   6. The aortic valve is normal in structure. Aortic valve regurgitation is  trivial. No aortic stenosis is present. Aortic regurgitation PHT measures  326 msec.   7. The inferior vena cava is normal in size with greater than 50%  respiratory variability, suggesting right atrial pressure of 3 mmHg.     ASSESSMENT:    1. History of atrial tachycardia   2. Paroxysmal atrial fibrillation (HCC)   3. Status post ablation of atrial fibrillation   4. Pectus excavatum     PLAN:  Mr. Jayson is a 63 year old gentleman who remotely had developed atrial tachycardia as well as an episode of wide-complex tachycardia.  While in [place]  he had undergone prior cardiac catheterization, echocardiography, EP evaluation as well as cardiac MRI.  He is status post successful ablation for atrial fibrillation and remotely had been on flecainide in addition to metoprolol .  Presently, he continues to take metoprolol  succinate 25 mg.  He was previously on CPAP therapy for obstructive sleep apnea but most recently 1 evaluated by Dr. Carlie he was not felt to have significant obstruction.  He is now using a customized oral appliance with benefit.  He has remained active and continues to run typically 5K's.  He is status post TURP P and is no longer on tamsulosin .  He tells me his insurance is changing and may need to be changing to a different primary  provider.  Clinically he is stable without chest pain or shortness of breath.  He is unaware of recurrent palpitations.  I discussed with him my plans for retirement later this year.  I have suggested he be transition to the care of Dr. Dawna Bruckner in our [place] office with plans for evaluation in 1 year or sooner as needed.   Medication Adjustments/Labs and Tests Ordered: Current medicines are reviewed at length with the patient today.  Concerns regarding medicines are outlined above.  Medication changes, Labs and Tests ordered today  are listed in the Patient Instructions below. Patient Instructions  Medication Instructions:  No medication changes were made during today's visit  *If you need a refill on your cardiac medications before your next appointment, please call your pharmacy*   Lab Work: No labs were ordered during today's visit.  If you have labs (blood work) drawn today and your tests are completely normal, you will receive your results only by: MyChart Message (if you have MyChart) OR A paper copy in the mail If you have any lab test that is abnormal or we need to change your treatment, we will call you to review the results.   Testing/Procedures: No procedures ordered today.    Follow-Up: At [facility], you and your health needs are our priority.  As part of our continuing mission to provide you with exceptional heart care, we have created designated Provider Care Teams.  These Care Teams include your primary Cardiologist (physician) and Advanced Practice Providers (APPs -  Physician Assistants and Nurse Practitioners) who all work together to provide you with the care you need, when you need it.  We recommend signing up for the patient portal called MyChart.  Sign up information is provided on this After Visit Summary.  MyChart is used to connect with patients for Virtual Visits (Telemedicine).  Patients are able to view lab/test results, encounter  notes, upcoming appointments, etc.  Non-urgent messages can be sent to your provider as well.   To learn more about what you can do with MyChart, go to [email or web address].    Your next appointment:   1 year(s)  Provider:   Shelda Bruckner, MD    Other Instruction A letter will be mailed to you as a reminder to call the office for your follow up appointment.   Thank you for choosing  [facility]!       Signed, Debby Sor, MD  07/30/2023 1:49 PM    [facility] [address], [address], [place], [place]  [address] Phone: [phone]

## 2023-07-30 ENCOUNTER — Encounter: Payer: Self-pay | Admitting: Cardiovascular Disease

## 2023-12-11 ENCOUNTER — Other Ambulatory Visit (HOSPITAL_COMMUNITY): Payer: Self-pay

## 2023-12-11 ENCOUNTER — Telehealth: Payer: Self-pay | Admitting: Cardiovascular Disease

## 2023-12-11 MED ORDER — METOPROLOL SUCCINATE ER 25 MG PO TB24
25.0000 mg | ORAL_TABLET | Freq: Every day | ORAL | 2 refills | Status: DC
  Filled 2023-12-11: qty 90, 90d supply, fill #0

## 2023-12-11 MED ORDER — METOPROLOL SUCCINATE ER 25 MG PO TB24: 25.0000 mg | ORAL_TABLET | Freq: Every day | ORAL | 2 refills | Status: AC

## 2023-12-11 NOTE — Addendum Note (Signed)
 Addended by: DEVORA CASTOR A on: 12/11/2023 09:00 AM   Modules accepted: Orders

## 2023-12-11 NOTE — Telephone Encounter (Signed)
*  STAT* If patient is at the pharmacy, call can be transferred to refill team.   1. Which medications need to be refilled? (please list name of each medication and dose if known) metoprolol  succinate (TOPROL -XL) 25 MG 24 hr tablet   NEW PHARMACY   4. Which pharmacy/location (including street and city if local pharmacy) is medication to be sent to?  [id number] - [place], [place] - [address] AT [facility] [address] [facility] [address] Phone: [phone]  Fax: [phone]       5. Do they need a 30 day or 90 day supply? 90   Last seen 07/28/23
